# Patient Record
Sex: Male | Born: 1980
Health system: Southern US, Community
[De-identification: ages and names within clinical notes are randomized; demographics above are authoritative.]

## PROBLEM LIST (undated history)

## (undated) DIAGNOSIS — Z9889 Other specified postprocedural states: Secondary | ICD-10-CM

## (undated) DIAGNOSIS — M1990 Unspecified osteoarthritis, unspecified site: Secondary | ICD-10-CM

## (undated) DIAGNOSIS — K259 Gastric ulcer, unspecified as acute or chronic, without hemorrhage or perforation: Secondary | ICD-10-CM

## (undated) HISTORY — PX: KNEE SURGERY: SHX244

---

## 2002-03-07 ENCOUNTER — Emergency Department (HOSPITAL_COMMUNITY): Admission: EM | Admit: 2002-03-07 | Discharge: 2002-03-07 | Payer: Self-pay | Admitting: Emergency Medicine

## 2003-02-09 ENCOUNTER — Emergency Department (HOSPITAL_COMMUNITY): Admission: EM | Admit: 2003-02-09 | Discharge: 2003-02-09 | Payer: Self-pay | Admitting: *Deleted

## 2003-09-07 ENCOUNTER — Emergency Department (HOSPITAL_COMMUNITY): Admission: EM | Admit: 2003-09-07 | Discharge: 2003-09-07 | Payer: Self-pay | Admitting: *Deleted

## 2004-11-27 ENCOUNTER — Emergency Department (HOSPITAL_COMMUNITY): Admission: EM | Admit: 2004-11-27 | Discharge: 2004-11-27 | Payer: Self-pay | Admitting: Family Medicine

## 2005-07-18 ENCOUNTER — Emergency Department (HOSPITAL_COMMUNITY): Admission: EM | Admit: 2005-07-18 | Discharge: 2005-07-18 | Payer: Self-pay | Admitting: Emergency Medicine

## 2005-07-20 ENCOUNTER — Inpatient Hospital Stay (HOSPITAL_COMMUNITY): Admission: EM | Admit: 2005-07-20 | Discharge: 2005-07-22 | Payer: Self-pay | Admitting: Emergency Medicine

## 2005-08-24 ENCOUNTER — Emergency Department (HOSPITAL_COMMUNITY): Admission: EM | Admit: 2005-08-24 | Discharge: 2005-08-24 | Payer: Self-pay | Admitting: Emergency Medicine

## 2007-11-16 ENCOUNTER — Emergency Department (HOSPITAL_COMMUNITY): Admission: EM | Admit: 2007-11-16 | Discharge: 2007-11-16 | Payer: Self-pay | Admitting: Emergency Medicine

## 2008-01-10 ENCOUNTER — Emergency Department (HOSPITAL_COMMUNITY): Admission: EM | Admit: 2008-01-10 | Discharge: 2008-01-10 | Payer: Self-pay | Admitting: Family Medicine

## 2008-07-31 ENCOUNTER — Emergency Department (HOSPITAL_COMMUNITY): Admission: EM | Admit: 2008-07-31 | Discharge: 2008-07-31 | Payer: Self-pay | Admitting: Family Medicine

## 2009-03-16 ENCOUNTER — Emergency Department (HOSPITAL_COMMUNITY): Admission: EM | Admit: 2009-03-16 | Discharge: 2009-03-16 | Payer: Self-pay | Admitting: Family Medicine

## 2009-04-09 ENCOUNTER — Emergency Department (HOSPITAL_COMMUNITY): Admission: EM | Admit: 2009-04-09 | Discharge: 2009-04-09 | Payer: Self-pay | Admitting: Emergency Medicine

## 2009-04-13 ENCOUNTER — Other Ambulatory Visit: Admission: RE | Admit: 2009-04-13 | Discharge: 2009-04-13 | Payer: Self-pay | Admitting: Otolaryngology

## 2009-05-03 ENCOUNTER — Ambulatory Visit (HOSPITAL_BASED_OUTPATIENT_CLINIC_OR_DEPARTMENT_OTHER): Admission: RE | Admit: 2009-05-03 | Discharge: 2009-05-03 | Payer: Self-pay | Admitting: Otolaryngology

## 2009-05-03 ENCOUNTER — Encounter (INDEPENDENT_AMBULATORY_CARE_PROVIDER_SITE_OTHER): Payer: Self-pay | Admitting: Otolaryngology

## 2009-05-18 ENCOUNTER — Ambulatory Visit (HOSPITAL_BASED_OUTPATIENT_CLINIC_OR_DEPARTMENT_OTHER): Admission: RE | Admit: 2009-05-18 | Discharge: 2009-05-18 | Payer: Self-pay | Admitting: General Surgery

## 2011-01-27 LAB — POCT HEMOGLOBIN-HEMACUE
Hemoglobin: 13.7 g/dL (ref 13.0–17.0)
Hemoglobin: 14.5 g/dL (ref 13.0–17.0)

## 2011-03-05 NOTE — Op Note (Signed)
NAMENORM, WRAY NO.:  [id number]   MEDICAL RECORD NO.:  [id number]          PATIENT TYPE:  AMB   LOCATION:  DSC                          FACILITY:  [facility]   PHYSICIAN:  Suzanna Obey, M.D.       DATE OF BIRTH:  11/01/1980   DATE OF PROCEDURE:  05/03/2009  DATE OF DISCHARGE:                               OPERATIVE REPORT   PREOPERATIVE DIAGNOSES:  Right submandibular mass.   POSTOPERATIVE DIAGNOSIS:  Right submandibular mass.   SURGICAL PROCEDURE:  Excision of right submandibular gland with mass.   ANESTHESIA:  General.   ESTIMATED BLOOD LOSS:  Less than 10 mL.   INDICATIONS:  This is a 30 year old with a mass in his right neck that  has failed to resolve after medical therapy.  It is fairly large and  firm and fine-needle aspiration was consistent with possible salivary  gland findings.  He was informed of the risks and benefits of the  procedure and options were discussed.  All questions were answered and  consent was obtained.   OPERATION:  The patient was taken to the operating room, placed in the  supine position.  After general endotracheal tube anesthesia, he was  placed in the left gaze position.  The edge of the mandible was marked.  An incision was made 3-cm below the edge of the mandible down around the  digastric hyoid level.  Dissection was carried down to the platysma  muscle.  The platysma muscle was divided and dissection was carried out  on top of the gland and mass.  The mass was removed which was almost  separate but connected to the submandibular gland.  The submandibular  gland was then dissected from inferior, dissecting along the digastric,  bringing the gland up.  The omohyoid was dissected medially and the  lingual nerve was identified.  The lingual nerve ganglion was divided  and the single nerve was left intact.  The hypoglossal nerve could be  seen in the depths of the bed of the submandibular triangle.  It was not  violated.   The gland was then removed after clamping the artery and the  entire time the tissue was dissected superiorly and along the capsule of  the gland to preserve the marginal mandibular nerve.  The wound was  irrigated.  There was not much space from a standpoint of the defects,  so no drain was placed.  The wound was closed with an interrupted 4-0  chromic, reapproximated the platysma and the subcutaneous tissue with a  running 5-0 nylon.  The patient was awakened, brought to recovery room  in stable condition, and counts correct.           ______________________________  Suzanna Obey, M.D.     JB/MEDQ  D:  05/03/2009  T:  05/04/2009  Job:  [id number]

## 2011-03-05 NOTE — Op Note (Signed)
NAMERAZI, HICKLE NO.:  [id number]   MEDICAL RECORD NO.:  [id number]          PATIENT TYPE:  AMB   LOCATION:  DSC                          FACILITY:  [facility]   PHYSICIAN:  Suzanna Obey, M.D.       DATE OF BIRTH:  1981/04/12   DATE OF PROCEDURE:  05/18/2009  DATE OF DISCHARGE:                               OPERATIVE REPORT   PREOPERATIVE DIAGNOSES:  Deviated septum and turbinate hypertrophy.   POSTOPERATIVE DIAGNOSES:  Deviated septum and turbinate hypertrophy.   SURGICAL PROCEDURES:  Septoplasty and submucous resection of inferior  turbinates.   ANESTHESIA:  General.   ESTIMATED BLOOD LOSS:  Approximately 10 mL.   INDICATIONS:  This is a 30 year old who has had a significant nasal  obstruction, congestion issue, and severe deviation of septum on the  right.  He has failed medical therapy.  He is informed of the risk and  benefits of the procedure and options were discussed.  All questions  were answered and consent was obtained.   OPERATION:  The patient was taken to the operating room and placed in  the supine position.  After general endotracheal tube anesthesia, he was  prepped and draped in the usual sterile manner.  The inferior turbinate  and septum were injected with 1% lidocaine with 1:100,000 epinephrine.  A right hemitransfixion incision was performed raising mucoperichondrial  ostial flap.  The cartilages were divided about 2 cm posterior to the  caudal strut and this deviated portion of the cartilage was removed  which was significantly deviated into the right side.  Jansen-Middleton  forceps were used to remove the deviated portion of the bone.  The 4-mm  osteotome was used to remove the spur inferiorly and this corrected the  septal deflection.  Turbinates were infractured.  Midline incision was  made with 15 blade.  Mucosal flap was elevated superiorly and inferior  mucosa and bone were removed with the turbinate scissors.  The edge was  cauterized with suction cautery and the flap was laid back down over the  raw surface and the turbinates were outfractured.  Hemitransfixion  incision was closed with interrupted 4-0 chromic and 4-0 plain gut  quilting stitch.  The nasopharynx was suctioned out of all blood and  debris and there was good hemostasis, so no packing was placed.  Pledgets were placed along the inferior edge of the turbinate while the  patient was wakened from anesthesia.  He was awakened and brought to  Recovery in stable condition.  Counts were correct.           ______________________________  Suzanna Obey, M.D.     JB/MEDQ  D:  05/18/2009  T:  05/19/2009  Job:  [id number]

## 2011-03-08 NOTE — Op Note (Signed)
Kenneth Glass, Kenneth Glass                ACCOUNT NO.:  [id number]   MEDICAL RECORD NO.:  [id number]          PATIENT TYPE:  INP   LOCATION:  1420                         FACILITY:  [facility]   PHYSICIAN:  Shirley Friar, MDDATE OF BIRTH:  1981/08/25   DATE OF PROCEDURE:  07/21/2005  DATE OF DISCHARGE:                                 OPERATIVE REPORT   PROCEDURE PERFORMED:  Upper endoscopy.   INDICATIONS FOR PROCEDURE:  Melena, recent NSAID use.   MEDICATIONS GIVEN:  Demerol 40 mg IV, Versed 4 mg IV.   DESCRIPTION OF PROCEDURE:  Endoscope was inserted through the oropharynx and  the esophagus was intubated with the upper esophageal sphincter noted at 15  cm from the incisors.  The endoscope was passed down through the esophagus,  which was normal in appearance with gastroesophageal junction noted at  approximately 42 cm from the incisors.  Endoscope was passed down into the  stomach and then the distal portion of the stomach at the pyloric channel  was deformed due to edema with scattered subepithelial hemorrhages  consistent with gastritis around the pyloric channel.  In the immediate  vicinity of this in the pyloric channel there was a large clean based ulcer  approximately 1 cm in size that had surrounding edema and erythema.  No  visible vessel and no black eschar noted in the base of this gastric ulcer.  Despite careful endoscopic evaluation of this area, no visible vessels were  found and no active bleeding was seen.  The endoscope was advanced carefully  into the duodenal bulb which was normal in appearance without additional  ulcers seen.  The endoscope was advanced further down into the second  portion of the duodenum which was normal.  On careful withdrawal of the  endoscope, again the ulcer was re-evaluated and no bleeding stigmata was  seen.  A biopsy of the antrum was taken for CLO testing. Retroflexion  revealed normal angularis, cardia and fundus except for a small hiatal  hernia.   Endoscope was withdrawn and confirmed the above findings.   DIAGNOSES:  1.  Large gastric ulcer, clean based, approximately 1 cm in size in the      pyloric channel.  2.  Hiatal hernia, small.   PLAN:  1.  Continue IV proton pump inhibitor times 24 hours, then change to p.o.      proton pump inhibitor twice daily.  2.  No NSAIDS.  3.  Will repeat EGD in eight to 12 weeks to assess healing of gastric ulcer.      Shirley Friar, MD  Electronically Signed     VCS/MEDQ  D:  07/21/2005  T:  07/22/2005  Job:  [id number]

## 2011-03-08 NOTE — Consult Note (Signed)
Kenneth Glass, Kenneth Glass                ACCOUNT NO.:  [id number]   MEDICAL RECORD NO.:  [id number]          PATIENT TYPE:  INP   LOCATION:  1420                         FACILITY:  [facility]   PHYSICIAN:  Shirley Friar, MDDATE OF BIRTH:  [birth year]   DATE OF CONSULTATION:  DATE OF DISCHARGE:                                   CONSULTATION   INDICATIONS FOR CONSULTATION:  Melena.   HISTORY OF PRESENT ILLNESS:  The patient is a 30 year old black male who  presents with headaches for several weeks that he has been treating for the  past week with Advil, Excedrin, and ibuprofen.  He states he has been taking  ibuprofen 2 or 3 times a day for the last week for his headache.  He had 2  syncopal episodes a few days ago and came into the emergency department  yesterday.  We had a negative CAT scan of his head and had a hemoglobin  which came back low at 7.3, and he was called back to the emergency room for  further evaluation.  He also reports epigastric abdominal pain for the past  2 weeks.  Prior to that, he had back pain.  He does report some nausea and  vomiting intermittently, but he states this has only occurred several times.  The melena preceded his nausea per his history.  He denies any fevers,  chills, hematochezia, or hematemesis.  He denies any tobacco or alcohol  history.   He is hemodynamically stable during his evaluation.  He was found to have a  hemoglobin of 6.9 and hematocrit 20.  He also had black tarry stools on  rectal exam in the emergency department.   PAST MEDICAL HISTORY:  None.   MEDICATIONS:  None except as stated in HPI.   SOCIAL HISTORY:  He denies tobacco and alcohol.   FAMILY HISTORY:  Positive for pancreatic cancer on the mother's side of the  family, sickle cell, diabetes, and possible Crohn's on the father's side.   PHYSICAL EXAMINATION:  VITAL SIGNS:  Temperature 97.6, pulse 88 (101 on  initial presentation), blood pressure 126/76.  GENERAL:  Well  nourished.  In no acute distress.  Alert and awake x4.  HEENT:  Anicteric sclerae.  Oropharynx clear.  CHEST:  Clear to auscultation bilaterally.  CARDIOVASCULAR:  Regular rate and rhythm, without murmurs.  ABDOMEN:  Epigastric tenderness, otherwise nontender, soft, not distended,  with active bowel sounds.  EXTREMITIES:  No edema.   LABORATORY DATA:  White blood count 4.5, hemoglobin 6.9, hematocrit 20,  platelet count 238.   ASSESSMENT:  A 30 year old black male presents with signs and symptoms of  upper gastrointestinal bleed in the setting of recent nonsteroidal  antiinflammatory drug use.  I suspect he has a small peptic ulcer that has  bled, but there is no evidence of an acute active bleed at this time.  He  also could have an arteriovenous malformation or a Mallory-Weiss tear,  although based on his history, I suspect he may have a gastric or duodenal  ulcer.   PLAN:  1.  We will admit  the patient to medicine with GI consultation.  2.  IV PPI 40 mg q.12 h.  3.  Transfuse, volume resuscitate.  4.  Upper endoscopy scheduled for the morning on July 21, 2005.  5.  Will follow up closely.      Shirley Friar, MD  Electronically Signed     VCS/MEDQ  D:  07/20/2005  T:  07/21/2005  Job:  [id number]

## 2011-03-08 NOTE — H&P (Signed)
Glass, Kenneth NO.:  [id number]   MEDICAL RECORD NO.:  [id number]          PATIENT TYPE:  EMS   LOCATION:  ED                           FACILITY:  [facility]   PHYSICIAN:  Deirdre Peer. Polite, M.D. DATE OF BIRTH:  May 26, 1981   DATE OF ADMISSION:  07/20/2005  DATE OF DISCHARGE:                                HISTORY & PHYSICAL   CHIEF COMPLAINT:  Syncope x2.   HISTORY OF PRESENT ILLNESS:  Kenneth Glass is a 30 year old male with really no  significant past medical history who is brought to the ED for evaluation.  According to the patient, he had micturition syncope x2 yesterday.  Patient  was seen in the ED because of that.  In the ED, the patient made mention of  chronic headache that he has been having.  The patient had a CT of his head  which was negative.  He was discharged to home.  Later patient had blood  work which showed a significantly low hemoglobin.  Patient was brought back  to the ED for evaluation.  In the ED patient reported having trouble with  headaches, therefore taking significant pain medications, Goody powder,  Advil, Excedrin.  He denies any bright red emesis.  Denies any emesis, but  does admit to some vague abdominal discomfort.  Does admit to bright colored  stools x2 weeks.  Denies any bright red blood stools.  Denied ever having  history of blood in his stool before.  He does admit to taking significant  NSAIDs.  In addition to micturition syncope, the patient has been fairly  tired, weak, no energy, lightheaded, and dizzy, which required him to work a  little slower at work.  As stated, in the ED the patient was evaluated, had  hemoglobin of 6.9, heme-positive stool, therefore admission is deemed  necessary for further evaluation and treatment.   PAST MEDICAL HISTORY:  Significant for chronic headaches, occasional chronic  back pains for many, many years.   MEDICATIONS:  Goody powders, Advil, Tylenol, and Excedrin.   SOCIAL HISTORY:   Negative for tobacco, alcohol, or drugs.   PAST SURGICAL HISTORY:  Left knee surgical torn ACL.   FAMILY HISTORY:  Mother with hypertension, father with sickle cell disease,  history of CVA, two sisters that are healthy.   ALLERGIES:  Patient has allergy to SULFA.  He is unsure of the reaction.   REVIEW OF SYSTEMS:  As stated in the HPI, otherwise negative.   PHYSICAL EXAMINATION:  GENERAL:  The patient is alert and oriented x3.  VITAL SIGNS:  Blood pressure 130/78, pulse 101, respiratory rate 16,  saturating 100%, temperature 98.3.  HEENT:  Within normal limits.  CHEST:  Clear to auscultation.  CARDIOVASCULAR:  Regular.  ABDOMEN:  Soft, nontender.  No mass appreciated.  EXTREMITIES:  No edema.  Does have pale nail beds.  RECTAL:  Per ER physician.  Hemoccult positive for black stool.   ASSESSMENT:  1.  Symptomatic anemia in a patient with history of chronic NSAID use, heme-      positive stools in the ED.  2.  History of headache.  Please note CT report is negative.   Recommend patient be admitted to a telemetry floor bed.  Patient will be  resuscitated with packed red blood cells.  Will obtain coags and serial H&H.  Obtain a GI evaluation for endoscopy.  I will place the patient on IV PPI.  Will make further recommendations after review of the above studies.      Deirdre Peer. Polite, M.D.  Electronically Signed     RDP/MEDQ  D:  07/20/2005  T:  07/20/2005  Job:  [id number]

## 2011-05-02 ENCOUNTER — Inpatient Hospital Stay (INDEPENDENT_AMBULATORY_CARE_PROVIDER_SITE_OTHER)
Admission: RE | Admit: 2011-05-02 | Discharge: 2011-05-02 | Disposition: A | Discharge status: Home / Self Care | Payer: Self-pay | Source: Ambulatory Visit | Attending: Emergency Medicine | Admitting: Emergency Medicine

## 2011-05-02 DIAGNOSIS — J020 Streptococcal pharyngitis: Secondary | ICD-10-CM

## 2011-05-02 LAB — POCT RAPID STREP A: Streptococcus, Group A Screen (Direct): POSITIVE — AB

## 2012-04-18 ENCOUNTER — Emergency Department (HOSPITAL_BASED_OUTPATIENT_CLINIC_OR_DEPARTMENT_OTHER)
Admission: EM | Admit: 2012-04-18 | Discharge: 2012-04-18 | Disposition: A | Discharge status: Home / Self Care | Payer: Self-pay | Attending: Emergency Medicine | Admitting: Emergency Medicine

## 2012-04-18 ENCOUNTER — Encounter (HOSPITAL_BASED_OUTPATIENT_CLINIC_OR_DEPARTMENT_OTHER): Payer: Self-pay | Admitting: Emergency Medicine

## 2012-04-18 DIAGNOSIS — H6120 Impacted cerumen, unspecified ear: Secondary | ICD-10-CM | POA: Insufficient documentation

## 2012-04-18 NOTE — ED Notes (Signed)
Left earache x 2 weeks.  No fever or injury. No drainage.  Some hearing loss.

## 2012-04-18 NOTE — ED Provider Notes (Signed)
History     CSN: [id number]  Arrival date & time 04/18/12  1234   First MD Initiated Contact with Patient 04/18/12 1248      Chief Complaint  Patient presents with  . Otalgia    (Consider location/radiation/quality/duration/timing/severity/associated sxs/prior treatment) HPI Comments: Pt states that he feels like he has been progressively unable to hear out of it  Patient is a 31 y.o. male presenting with ear pain. The history is provided by the patient. No language interpreter was used.  Otalgia This is a new problem. The current episode started more than 1 week ago. There is pain in the left ear. The problem occurs constantly. The problem has been gradually worsening. There has been no fever. Pertinent negatives include no ear discharge, no rhinorrhea and no sore throat.    History reviewed. No pertinent past medical history.  Past Surgical History  Procedure Date  . Knee surgery     History reviewed. No pertinent family history.  History  Substance Use Topics  . Smoking status: Never Smoker   . Smokeless tobacco: Not on file  . Alcohol Use: No      Review of Systems  Constitutional: Negative.   HENT: Positive for ear pain. Negative for sore throat, rhinorrhea and ear discharge.   Respiratory: Negative.   Cardiovascular: Negative.     Allergies  Sulfa antibiotics  Home Medications  No current outpatient prescriptions on file.  BP 114/73  Pulse 81  Temp 98.2 F (36.8 C) (Oral)  Resp 16  SpO2 99%  Physical Exam  Nursing note and vitals reviewed. Constitutional: He appears well-developed and well-nourished.  HENT:  Head: Normocephalic and atraumatic.  Right Ear: External ear normal.       Left cerumen impaction  Cardiovascular: Normal rate and regular rhythm.   Pulmonary/Chest: Effort normal and breath sounds normal.    ED Course  Procedures (including critical care time)  Labs Reviewed - No data to display No results found.   1. Cerumen  impaction       MDM  Ear irrigated my nurse and pt tm intact:pt hearing a lot better         Teressa Lower, NP 04/18/12 1452

## 2012-04-18 NOTE — ED Notes (Signed)
D/c home with family- no new rx given 

## 2012-04-18 NOTE — Discharge Instructions (Signed)
Cerumen Impaction  A cerumen impaction is when the wax in your ear forms a plug. This plug usually causes reduced hearing. Sometimes it also causes an earache or dizziness. Removing a cerumen impaction can be difficult and painful. The wax sticks to the ear canal. The canal is sensitive and bleeds easily. If you try to remove a heavy wax buildup with a cotton tipped swab, you may push it in further.  Irrigation with water, suction, and small ear curettes may be used to clear out the wax. If the impaction is fixed to the skin in the ear canal, ear drops may be needed for a few days to loosen the wax. People who build up a lot of wax frequently can use ear wax removal products available in your local drugstore.  SEEK MEDICAL CARE IF:    You develop an earache, increased hearing loss, or marked dizziness.  Document Released: 11/14/2004 Document Revised: 09/26/2011 Document Reviewed: 01/04/2010  [employer] Patient Information 2012 [employer].

## 2012-04-22 NOTE — ED Provider Notes (Signed)
Medical screening examination/treatment/procedure(s) were performed by non-physician practitioner and as supervising physician I was immediately available for consultation/collaboration.  Cyndra Numbers, MD 04/22/12 [phone]

## 2012-07-15 ENCOUNTER — Emergency Department (HOSPITAL_BASED_OUTPATIENT_CLINIC_OR_DEPARTMENT_OTHER)
Admission: EM | Admit: 2012-07-15 | Discharge: 2012-07-15 | Disposition: A | Discharge status: Home / Self Care | Payer: No Typology Code available for payment source | Attending: Emergency Medicine | Admitting: Emergency Medicine

## 2012-07-15 ENCOUNTER — Encounter (HOSPITAL_BASED_OUTPATIENT_CLINIC_OR_DEPARTMENT_OTHER): Payer: Self-pay

## 2012-07-15 ENCOUNTER — Emergency Department (HOSPITAL_BASED_OUTPATIENT_CLINIC_OR_DEPARTMENT_OTHER): Payer: No Typology Code available for payment source

## 2012-07-15 DIAGNOSIS — S20219A Contusion of unspecified front wall of thorax, initial encounter: Secondary | ICD-10-CM

## 2012-07-15 DIAGNOSIS — S60229A Contusion of unspecified hand, initial encounter: Secondary | ICD-10-CM | POA: Insufficient documentation

## 2012-07-15 DIAGNOSIS — Z882 Allergy status to sulfonamides status: Secondary | ICD-10-CM | POA: Insufficient documentation

## 2012-07-15 DIAGNOSIS — S8000XA Contusion of unspecified knee, initial encounter: Secondary | ICD-10-CM | POA: Insufficient documentation

## 2012-07-15 DIAGNOSIS — S8001XA Contusion of right knee, initial encounter: Secondary | ICD-10-CM

## 2012-07-15 DIAGNOSIS — S60221A Contusion of right hand, initial encounter: Secondary | ICD-10-CM

## 2012-07-15 DIAGNOSIS — Y92410 Unspecified street and highway as the place of occurrence of the external cause: Secondary | ICD-10-CM | POA: Insufficient documentation

## 2012-07-15 MED ORDER — OXYCODONE-ACETAMINOPHEN 5-325 MG PO TABS
1.0000 | ORAL_TABLET | Freq: Once | ORAL | Status: AC
  Administered 2012-07-15: 1 via ORAL
  Filled 2012-07-15 (×2): qty 1

## 2012-07-15 MED ORDER — OXYCODONE-ACETAMINOPHEN 5-325 MG PO TABS: 1.0000 | ORAL_TABLET | ORAL | Status: DC | PRN
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
## 2013-04-08 ENCOUNTER — Emergency Department (HOSPITAL_BASED_OUTPATIENT_CLINIC_OR_DEPARTMENT_OTHER)
Admission: EM | Admit: 2013-04-08 | Discharge: 2013-04-08 | Disposition: A | Discharge status: Home / Self Care | Payer: Self-pay | Attending: Emergency Medicine | Admitting: Emergency Medicine

## 2013-04-08 ENCOUNTER — Encounter (HOSPITAL_BASED_OUTPATIENT_CLINIC_OR_DEPARTMENT_OTHER): Payer: Self-pay

## 2013-04-08 DIAGNOSIS — L738 Other specified follicular disorders: Secondary | ICD-10-CM | POA: Insufficient documentation

## 2013-04-08 DIAGNOSIS — L02215 Cutaneous abscess of perineum: Secondary | ICD-10-CM

## 2013-04-08 DIAGNOSIS — L739 Follicular disorder, unspecified: Secondary | ICD-10-CM

## 2013-04-08 DIAGNOSIS — N498 Inflammatory disorders of other specified male genital organs: Secondary | ICD-10-CM | POA: Insufficient documentation

## 2013-04-08 HISTORY — DX: Other specified postprocedural states: Z98.890

## 2013-04-08 MED ORDER — CLINDAMYCIN HCL 150 MG PO CAPS: 150.0000 mg | ORAL_CAPSULE | Freq: Four times a day (QID) | ORAL | Status: DC

## 2013-04-08 NOTE — ED Notes (Signed)
Pt has quarter size isolated swelling to left testicle with oozing pustulate.

## 2013-04-08 NOTE — ED Provider Notes (Signed)
History     CSN: [id number]  Arrival date & time 04/08/13  0750   First MD Initiated Contact with Patient 04/08/13 0756      Chief Complaint  Patient presents with  . Testicle Pain    (Consider location/radiation/quality/duration/timing/severity/associated sxs/prior treatment) Patient is a 32 y.o. male presenting with abscess.  Abscess Location:  Ano-genital Ano-genital abscess location:  Perineum Abscess quality: draining   Red streaking: no   Duration:  10 days Progression:  Partially resolved Chronicity:  New Context: not diabetes, not immunosuppression, not injected drug use, not insect bite/sting and not skin injury   Relieved by: Pt was in shower this AM and it began draining on its own, shrank in size, much less painful now. Associated symptoms: no fatigue, no fever and no nausea   Risk factors: no hx of MRSA and no prior abscess     Past Medical History  Diagnosis Date  . Hx of excision of mass     Past Surgical History  Procedure Laterality Date  . Knee surgery    . Knee surgery      History reviewed. No pertinent family history.  History  Substance Use Topics  . Smoking status: Never Smoker   . Smokeless tobacco: Not on file  . Alcohol Use: No      Review of Systems  Constitutional: Negative for fever and fatigue.  Gastrointestinal: Negative for nausea and abdominal pain.  Genitourinary: Positive for scrotal swelling. Negative for dysuria, difficulty urinating and testicular pain.  Skin: Positive for color change and wound.    Allergies  Sulfa antibiotics  Home Medications   Current Outpatient Rx  Name  Route  Sig  Dispense  Refill  . clindamycin (CLEOCIN) 150 MG capsule   Oral   Take 1 capsule (150 mg total) by mouth every 6 (six) hours.   28 capsule   0     BP 134/90  Pulse 72  Temp(Src) 98.1 F (36.7 C) (Oral)  Resp 20  Ht 6\' 2"  (1.88 m)  Wt 185 lb (83.915 kg)  BMI 23.74 kg/m2  SpO2 98%  Physical Exam  Nursing note and  vitals reviewed. Constitutional: He appears well-developed and well-nourished. No distress.  Neck: Neck supple.  Pulmonary/Chest: Effort normal. No respiratory distress.  Genitourinary:     Neurological: He is alert.  Skin: Skin is warm. No rash noted.    ED Course  Procedures (including critical care time)  Labs Reviewed - No data to display No results found.   1. Abscess, perineum   2. Folliculitis     ra sat is 98% and I interpret to be normal  MDM  Pt with appearance of abscess to base of scrotum and/or perineum.  Pt denies change to urination, defecation.  I was able to express purulent drainage from opening with reduction in size of the abscess to near normal appearance, no longer fluctuant.  Pt is afebrile, not toxic appearing.  Will refer to urology for follow up if not improving.  Pt encouraged to use sitz baths, will put on clindamycin.           Gavin Pound. Oletta Lamas, MD 04/08/13 [phone]

## 2013-04-08 NOTE — ED Notes (Signed)
MD at bedside. 

## 2013-04-08 NOTE — ED Notes (Addendum)
Pt reports left testicular swelling and pain x 2 weeks

## 2014-02-15 ENCOUNTER — Emergency Department (HOSPITAL_BASED_OUTPATIENT_CLINIC_OR_DEPARTMENT_OTHER)
Admission: EM | Admit: 2014-02-15 | Discharge: 2014-02-15 | Disposition: A | Discharge status: Home / Self Care | Payer: PRIVATE HEALTH INSURANCE | Attending: Emergency Medicine | Admitting: Emergency Medicine

## 2014-02-15 ENCOUNTER — Emergency Department (HOSPITAL_BASED_OUTPATIENT_CLINIC_OR_DEPARTMENT_OTHER): Payer: PRIVATE HEALTH INSURANCE

## 2014-02-15 ENCOUNTER — Encounter (HOSPITAL_BASED_OUTPATIENT_CLINIC_OR_DEPARTMENT_OTHER): Payer: Self-pay | Admitting: Emergency Medicine

## 2014-02-15 DIAGNOSIS — M533 Sacrococcygeal disorders, not elsewhere classified: Secondary | ICD-10-CM | POA: Insufficient documentation

## 2014-02-15 NOTE — Discharge Instructions (Signed)
Musculoskeletal Pain °Musculoskeletal pain is muscle and boney aches and pains. These pains can occur in any part of the body. Your caregiver may treat you without knowing the cause of the pain. They may treat you if blood or urine tests, X-rays, and other tests were normal.  °CAUSES °There is often not a definite cause or reason for these pains. These pains may be caused by a type of germ (virus). The discomfort may also come from overuse. Overuse includes working out too hard when your body is not fit. Boney aches also come from weather changes. Bone is sensitive to atmospheric pressure changes. °HOME CARE INSTRUCTIONS  °· Ask when your test results will be ready. Make sure you get your test results. °· Only take over-the-counter or prescription medicines for pain, discomfort, or fever as directed by your caregiver. If you were given medications for your condition, do not drive, operate machinery or power tools, or sign legal documents for 24 hours. Do not drink alcohol. Do not take sleeping pills or other medications that may interfere with treatment. °· Continue all activities unless the activities cause more pain. When the pain lessens, slowly resume normal activities. Gradually increase the intensity and duration of the activities or exercise. °· During periods of severe pain, bed rest may be helpful. Lay or sit in any position that is comfortable. °· Putting ice on the injured area. °· Put ice in a bag. °· Place a towel between your skin and the bag. °· Leave the ice on for 15 to 20 minutes, 3 to 4 times a day. °· Follow up with your caregiver for continued problems and no reason can be found for the pain. If the pain becomes worse or does not go away, it may be necessary to repeat tests or do additional testing. Your caregiver may need to look further for a possible cause. °SEEK IMMEDIATE MEDICAL CARE IF: °· You have pain that is getting worse and is not relieved by medications. °· You develop chest pain  that is associated with shortness or breath, sweating, feeling sick to your stomach (nauseous), or throw up (vomit). °· Your pain becomes localized to the abdomen. °· You develop any new symptoms that seem different or that concern you. °MAKE SURE YOU:  °· Understand these instructions. °· Will watch your condition. °· Will get help right away if you are not doing well or get worse. °Document Released: 10/07/2005 Document Revised: 12/30/2011 Document Reviewed: 06/11/2013 °ExitCare® Patient Information ©2014 [employer]. ° °

## 2014-02-15 NOTE — ED Notes (Signed)
Tailbone pain pt noticed Sunday night became worse yesterday no injury reported

## 2014-02-15 NOTE — ED Notes (Signed)
MD at bedside. 

## 2014-02-15 NOTE — ED Provider Notes (Signed)
CSN: [id number]     Arrival date & time 02/15/14  [id number] History   First MD Initiated Contact with Patient 02/15/14 [phone]     Chief Complaint  Patient presents with  . coccyx pain      (Consider location/radiation/quality/duration/timing/severity/associated sxs/prior Treatment) HPI 33 year old male comes in today complaining of pain over his coccyx that began yesterday. He can describe no discrete trauma and did not fall. He states the pain is sharp and is in the left coccyx area. He denies any fever, chills, swelling, redness, drainage, or pain with stooling. He has not had any similar pain in the past but does have a history of back problems. He has not taken any medication for this. Past Medical History  Diagnosis Date  . Hx of excision of mass    Past Surgical History  Procedure Laterality Date  . Knee surgery    . Knee surgery     History reviewed. No pertinent family history. History  Substance Use Topics  . Smoking status: Never Smoker   . Smokeless tobacco: Not on file  . Alcohol Use: No    Review of Systems  Constitutional: Negative.   HENT: Negative.   Respiratory: Negative.   Cardiovascular: Negative.   Gastrointestinal: Negative.   Genitourinary: Negative.   Neurological: Negative.       Allergies  Sulfa antibiotics  Home Medications   Prior to Admission medications   Medication Sig Start Date End Date Taking? Authorizing Provider  ibuprofen (ADVIL,MOTRIN) 200 MG tablet Take 400-600 mg by mouth every 8 (eight) hours as needed for moderate pain.   Yes Historical Provider, MD  Pseudoephedrine HCl (SUDAFED PO) Take 1 tablet by mouth daily as needed.   Yes Historical Provider, MD   BP 129/72  Pulse 75  Temp(Src) 98.2 F (36.8 C) (Oral)  Resp 18  SpO2 100% Physical Exam  Nursing note and vitals reviewed. Constitutional: He appears well-developed and well-nourished.  HENT:  Head: Normocephalic.  Eyes: Conjunctivae and EOM are normal. Pupils are equal,  round, and reactive to light.  Neck:  Back exam is normal with no tenderness to palpation over cervical thoracic or lumbar spine. There is tenderness palpation over the lateral aspect of the coccyx on the distal portion. There is no redness, swelling, or fluctuance noted.  Cardiovascular: Normal rate.   Pulmonary/Chest: Effort normal and breath sounds normal.  Abdominal: Soft. Bowel sounds are normal.  Musculoskeletal: Normal range of motion.  Neurological: He is alert. He has normal reflexes. He displays normal reflexes. No cranial nerve deficit. He exhibits normal muscle tone. Coordination normal.  Psychiatric: He has a normal mood and affect. His behavior is normal. Judgment normal.    ED Course  Procedures (including critical care time) Labs Review Labs Reviewed - No data to display  Imaging Review Dg Sacrum/coccyx  02/15/2014   CLINICAL DATA:  pain pain  EXAM: SACRUM AND COCCYX - 2+ VIEW  COMPARISON:  DG LUMBAR SPINE COMPLETE dated 07/22/2005  FINDINGS: There is no evidence of acute fracture or dislocation within the sacrum and coccyx. Grade 2 anterolisthesis is appreciated L5-S1 stable. Bilateral pars defects at L5 are again appreciated.  IMPRESSION: Bilateral pars defects and grade 2 anterolisthesis L5-S1.  No evidence of acute osseous abnormalities in the sacrum and coccyx.   Electronically Signed   By: Salome HolmesHector  Cooper M.D.   On: 02/15/2014 10:13     EKG Interpretation None      MDM   Final diagnoses:  Pain in the  coccyx    Radiographs obtained and no abnormality is seen. I have discussed return precautions specifically including worsening swelling or signs of infection. Patient voices understanding.    Hilario Quarryanielle S Jamiya Nims, MD 02/15/14 1314

## 2014-02-21 ENCOUNTER — Encounter (HOSPITAL_BASED_OUTPATIENT_CLINIC_OR_DEPARTMENT_OTHER): Payer: Self-pay | Admitting: Emergency Medicine

## 2014-02-21 ENCOUNTER — Emergency Department (HOSPITAL_BASED_OUTPATIENT_CLINIC_OR_DEPARTMENT_OTHER)
Admission: EM | Admit: 2014-02-21 | Discharge: 2014-02-21 | Disposition: A | Discharge status: Home / Self Care | Payer: PRIVATE HEALTH INSURANCE | Attending: Emergency Medicine | Admitting: Emergency Medicine

## 2014-02-21 DIAGNOSIS — Z8719 Personal history of other diseases of the digestive system: Secondary | ICD-10-CM | POA: Diagnosis not present

## 2014-02-21 DIAGNOSIS — L0231 Cutaneous abscess of buttock: Secondary | ICD-10-CM | POA: Insufficient documentation

## 2014-02-21 DIAGNOSIS — L0291 Cutaneous abscess, unspecified: Secondary | ICD-10-CM

## 2014-02-21 DIAGNOSIS — L03317 Cellulitis of buttock: Secondary | ICD-10-CM | POA: Diagnosis not present

## 2014-02-21 DIAGNOSIS — L0390 Cellulitis, unspecified: Secondary | ICD-10-CM

## 2014-02-21 HISTORY — DX: Gastric ulcer, unspecified as acute or chronic, without hemorrhage or perforation: K25.9

## 2014-02-21 MED ORDER — CLINDAMYCIN HCL 300 MG PO CAPS: 300.0000 mg | ORAL_CAPSULE | Freq: Four times a day (QID) | ORAL | Status: DC

## 2014-02-21 NOTE — ED Notes (Signed)
Pt with abscesses to sacral area.

## 2014-02-21 NOTE — Discharge Instructions (Signed)
Clindamycin as prescribed.  Warm soaks or sitz baths several times daily.  Return to the emergency department if your pain worsens, or you develop any other new and concerning symptoms.   Abscess An abscess is an infected area that contains a collection of pus and debris.It can occur in almost any part of the body. An abscess is also known as a furuncle or boil. CAUSES  An abscess occurs when tissue gets infected. This can occur from blockage of oil or sweat glands, infection of hair follicles, or a minor injury to the skin. As the body tries to fight the infection, pus collects in the area and creates pressure under the skin. This pressure causes pain. People with weakened immune systems have difficulty fighting infections and get certain abscesses more often.  SYMPTOMS Usually an abscess develops on the skin and becomes a painful mass that is red, warm, and tender. If the abscess forms under the skin, you may feel a moveable soft area under the skin. Some abscesses break open (rupture) on their own, but most will continue to get worse without care. The infection can spread deeper into the body and eventually into the bloodstream, causing you to feel ill.  DIAGNOSIS  Your caregiver will take your medical history and perform a physical exam. A sample of fluid may also be taken from the abscess to determine what is causing your infection. TREATMENT  Your caregiver may prescribe antibiotic medicines to fight the infection. However, taking antibiotics alone usually does not cure an abscess. Your caregiver may need to make a small cut (incision) in the abscess to drain the pus. In some cases, gauze is packed into the abscess to reduce pain and to continue draining the area. HOME CARE INSTRUCTIONS   Only take over-the-counter or prescription medicines for pain, discomfort, or fever as directed by your caregiver.  If you were prescribed antibiotics, take them as directed. Finish them even if you  start to feel better.  If gauze is used, follow your caregiver's directions for changing the gauze.  To avoid spreading the infection:  Keep your draining abscess covered with a bandage.  Wash your hands well.  Do not share personal care items, towels, or whirlpools with others.  Avoid skin contact with others.  Keep your skin and clothes clean around the abscess.  Keep all follow-up appointments as directed by your caregiver. SEEK MEDICAL CARE IF:   You have increased pain, swelling, redness, fluid drainage, or bleeding.  You have muscle aches, chills, or a general ill feeling.  You have a fever. MAKE SURE YOU:   Understand these instructions.  Will watch your condition.  Will get help right away if you are not doing well or get worse. Document Released: 07/17/2005 Document Revised: 04/07/2012 Document Reviewed: 12/20/2011 [facility] Patient Information 2014 [place]Care, [place].

## 2014-02-21 NOTE — ED Provider Notes (Signed)
CSN: [id number]     Arrival date & time 5/4/[id number]  1114 History   First MD Initiated Contact with Patient 02/21/14 1121     Chief Complaint  Patient presents with  . Abscess     (Consider location/radiation/quality/duration/timing/severity/associated sxs/prior Treatment) HPI Comments: Patient is a 33 year old male who presents for evaluation of sacral abscess. This was drained in [place] 2 days ago and he was advised to have followup in 2 days to discuss packing removal. He denies fevers or chills. He states that the area is still sore however is improving somewhat.  Patient is a 33 y.o. male presenting with abscess. The history is provided by the patient.  Abscess Abscess location: Buttock. Abscess quality: draining   Progression:  Improving Chronicity:  New   Past Medical History  Diagnosis Date  . Hx of excision of mass   . Gastric ulcer    Past Surgical History  Procedure Laterality Date  . Knee surgery    . Knee surgery     No family history on file. History  Substance Use Topics  . Smoking status: Never Smoker   . Smokeless tobacco: Not on file  . Alcohol Use: No    Review of Systems  All other systems reviewed and are negative.     Allergies  Sulfa antibiotics  Home Medications   Prior to Admission medications   Medication Sig Start Date End Date Taking? Authorizing Provider  HYDROcodone-acetaminophen (NORCO/VICODIN) 5-325 MG per tablet Take 1 tablet by mouth every 6 (six) hours as needed for moderate pain.   Yes Historical Provider, MD  ibuprofen (ADVIL,MOTRIN) 200 MG tablet Take 400-600 mg by mouth every 8 (eight) hours as needed for moderate pain.    Historical Provider, MD  Pseudoephedrine HCl (SUDAFED PO) Take 1 tablet by mouth daily as needed.    Historical Provider, MD   BP 119/93  Pulse 119  Temp(Src) 98.5 F (36.9 C) (Oral)  Resp 19  Ht 6\' 2"  (1.88 m)  Wt 185 lb (83.915 kg)  BMI 23.74 kg/m2  SpO2 100% Physical Exam  Nursing note and  vitals reviewed. Constitutional: He is oriented to person, place, and time. He appears well-developed and well-nourished. No distress.  HENT:  Head: Normocephalic and atraumatic.  Neck: Normal range of motion. Neck supple.  Neurological: He is alert and oriented to person, place, and time.  Skin: Skin is warm and dry. He is not diaphoretic.  There is a packing in place in the sacral region to the top of the buttock. There is continued per you once present, however no surrounding erythema or areas of fluctuance.    ED Course  Procedures (including critical care time) Labs Review Labs Reviewed - No data to display  Imaging Review No results found.   EKG Interpretation None      MDM   Final diagnoses:  None    Packing removed. Wound continues to drain peripheral and material. I will refrain from re packing and treat with antibiotics and warm soaks.    Geoffery Lyonsouglas Kitrina Maurin, MD 02/21/14 1140

## 2015-07-19 ENCOUNTER — Emergency Department (HOSPITAL_BASED_OUTPATIENT_CLINIC_OR_DEPARTMENT_OTHER): Payer: Self-pay

## 2015-07-19 ENCOUNTER — Emergency Department (HOSPITAL_BASED_OUTPATIENT_CLINIC_OR_DEPARTMENT_OTHER)
Admission: EM | Admit: 2015-07-19 | Discharge: 2015-07-19 | Disposition: A | Discharge status: Home / Self Care | Payer: Self-pay | Attending: Emergency Medicine | Admitting: Emergency Medicine

## 2015-07-19 ENCOUNTER — Encounter (HOSPITAL_BASED_OUTPATIENT_CLINIC_OR_DEPARTMENT_OTHER): Payer: Self-pay

## 2015-07-19 DIAGNOSIS — Z8719 Personal history of other diseases of the digestive system: Secondary | ICD-10-CM | POA: Insufficient documentation

## 2015-07-19 DIAGNOSIS — Z9889 Other specified postprocedural states: Secondary | ICD-10-CM | POA: Insufficient documentation

## 2015-07-19 DIAGNOSIS — Y998 Other external cause status: Secondary | ICD-10-CM | POA: Insufficient documentation

## 2015-07-19 DIAGNOSIS — S8992XA Unspecified injury of left lower leg, initial encounter: Secondary | ICD-10-CM | POA: Insufficient documentation

## 2015-07-19 DIAGNOSIS — Y9289 Other specified places as the place of occurrence of the external cause: Secondary | ICD-10-CM | POA: Insufficient documentation

## 2015-07-19 DIAGNOSIS — Y9389 Activity, other specified: Secondary | ICD-10-CM | POA: Insufficient documentation

## 2015-07-19 DIAGNOSIS — W1839XA Other fall on same level, initial encounter: Secondary | ICD-10-CM | POA: Insufficient documentation

## 2015-07-19 DIAGNOSIS — M25562 Pain in left knee: Secondary | ICD-10-CM

## 2015-07-19 MED ORDER — IBUPROFEN 800 MG PO TABS
800.0000 mg | ORAL_TABLET | Freq: Once | ORAL | Status: AC
  Administered 2015-07-19: 800 mg via ORAL
  Filled 2015-07-19: qty 1

## 2015-07-19 MED ORDER — IBUPROFEN 800 MG PO TABS: 800.0000 mg | ORAL_TABLET | Freq: Three times a day (TID) | ORAL | Status: DC

## 2015-07-19 NOTE — ED Notes (Signed)
Ice pack applied to left knee

## 2015-07-19 NOTE — ED Provider Notes (Signed)
CSN: [id number]     Arrival date & time 07/19/15  1934 History   First MD Initiated Contact with Patient 07/19/15 2050     Chief Complaint  Patient presents with  . Knee Injury    HPI   Kenneth Glass is a 34 y.o. male with a PMH of ACL tear and arthritis who presents to the ED with left knee pain. He reports he "misstepped" on Saturday and fell, landing on the medial aspect of his left knee. He states he has had left knee pain and swelling since that time. He reports his symptoms are constant. He states movement exacerbates his pain. He has tried ice and ibuprofen for symptom relief, which has helped. He denies numbness, paresthesia, weakness.   Past Medical History  Diagnosis Date  . Hx of excision of mass   . Gastric ulcer    Past Surgical History  Procedure Laterality Date  . Knee surgery    . Knee surgery     No family history on file. Social History  Substance Use Topics  . Smoking status: Never Smoker   . Smokeless tobacco: None  . Alcohol Use: No     Review of Systems  Constitutional: Negative for fever and chills.  Musculoskeletal: Positive for joint swelling and arthralgias.  Neurological: Negative for dizziness, syncope, weakness, light-headedness and numbness.  All other systems reviewed and are negative.     Allergies  Sulfa antibiotics  Home Medications   Prior to Admission medications   Medication Sig Start Date End Date Taking? Authorizing Provider  clindamycin (CLEOCIN) 300 MG capsule Take 1 capsule (300 mg total) by mouth 4 (four) times daily. X 7 days 02/21/14   Geoffery Lyons, MD  HYDROcodone-acetaminophen (NORCO/VICODIN) 5-325 MG per tablet Take 1 tablet by mouth every 6 (six) hours as needed for moderate pain.    Historical Provider, MD  ibuprofen (ADVIL,MOTRIN) 800 MG tablet Take 1 tablet (800 mg total) by mouth 3 (three) times daily. 07/19/15   Mady Gemma, PA-C  Pseudoephedrine HCl (SUDAFED PO) Take 1 tablet by mouth daily as needed.     Historical Provider, MD    BP 131/77 mmHg  Pulse 86  Temp(Src) 98 F (36.7 C) (Oral)  Resp 16  Ht  (1.88 m)  Wt 185 lb (83.915 kg)  BMI 23.74 kg/m2  SpO2 98% Physical Exam  Constitutional: He is oriented to person, place, and time. He appears well-developed and well-nourished. No distress.  HENT:  Head: Normocephalic and atraumatic.  Right Ear: External ear normal.  Left Ear: External ear normal.  Nose: Nose normal.  Mouth/Throat: Oropharynx is clear and moist.  Eyes: Conjunctivae and EOM are normal. Pupils are equal, round, and reactive to light. Right eye exhibits no discharge. Left eye exhibits no discharge. No scleral icterus.  Neck: Normal range of motion. Neck supple.  Cardiovascular: Normal rate and regular rhythm.   Pulmonary/Chest: Effort normal. No respiratory distress.  Musculoskeletal: Normal range of motion. He exhibits edema and tenderness.       Left knee: He exhibits swelling. He exhibits normal range of motion, no ecchymosis, no deformity, no erythema, no LCL laxity, normal patellar mobility, no bony tenderness and no MCL laxity. Tenderness found. No medial joint line and no lateral joint line tenderness noted.  Mild tenderness to palpation of posterior aspect of left knee. Mild edema to left knee, no significant erythema or heat. No MCL or LCL laxity. Negative posterior and anterior drawer testing. Full range of  motion of left lower extremity.   Neurological: He is alert and oriented to person, place, and time. He has normal strength. No sensory deficit.  Patient ambulates without difficulty. Strength and sensation intact.  Skin: Skin is warm and dry. No rash noted. He is not diaphoretic. No erythema. No pallor.  Psychiatric: He has a normal mood and affect. His behavior is normal. Judgment and thought content normal.  Nursing note and vitals reviewed.   ED Course  Procedures (including critical care time)  Labs Review Labs Reviewed - No data to  display  Imaging Review Dg Knee Complete 4 Views Left  07/19/2015   CLINICAL DATA:  Left knee pain after fall yesterday. Initial encounter.  EXAM: LEFT KNEE - COMPLETE 4+ VIEW  COMPARISON:  None.  FINDINGS: Changes of prior ACL repair with no comparison to determine stability of bone anchors.  Advanced tricompartmental osteoarthritis for age. There is also tib-fib arthritic change with spurring. Moderate knee joint effusion. No acute fracture or malalignment.  IMPRESSION: 1. Joint effusion without acute osseous finding. 2. Advanced osteoarthritis.   Electronically Signed   By: Marnee Spring M.D.   On: 07/19/2015 21:32     I have personally reviewed and evaluated these images as part of my medical decision-making.   EKG Interpretation None      MDM   Final diagnoses:  Left knee pain    34 year old male presents with left knee pain s/p fall. He reports pain and swelling. He denies numbness, weakness, paresthesia, difficulty ambulating.  Patient is afebrile with stable vitals. TTP over posterior aspect of left knee with mild edema. No significant erythema or heat. No MCL, LCL laxity. Anterior and posterior drawer testing negative. Full range of motion of left lower extremity. Strength and sensation intact. Patient ambulates without difficulty. Distal pulses intact. Compartments soft.  Imaging of left knee obtained, which is negative for acute fracture or malalignment; demonstrates joint effusion and advanced osteoarthritis. Patient given ice, ibuprofen, and knee sleeve. Advised to rest and elevate. Instructed patient to follow up with his orthopedist. He is scheduled to have a left knee replacement in January given his significant osteoarthritis s/p ACL tear. Return precautions discussed.   BP 131/77 mmHg  Pulse 86  Temp(Src) 98 F (36.7 C) (Oral)  Resp 16  Ht  (1.88 m)  Wt 185 lb (83.915 kg)  BMI 23.74 kg/m2  SpO2 98%   Mady Gemma, PA-C 07/20/15 1013  Arby Barrette, MD 08/02/15 [phone]

## 2015-07-19 NOTE — ED Notes (Signed)
Pt verbalizes understanding of d/c instructions and denies any further needs at this time. 

## 2015-07-19 NOTE — ED Notes (Addendum)
Pt states his left "knee gave out" yesterday, and he fell forward but was able to catch himself.  Mild swelling and warmth to knee, walking with little difficulty.  Pt now recalls landing on medial side of knee when he landed.

## 2015-07-19 NOTE — Discharge Instructions (Signed)
1. Medications: ibuprofen, usual home medications 2. Treatment: rest, drink plenty of fluids, ice, elevate, wear knee sleeve 3. Follow Up: please followup with your primary doctor and orthopedist within the next week for discussion of your diagnoses and further evaluation after today's visit; if you do not have a primary care doctor use the resource guide provided to find one; please return to the ER for severe pain, difficulty walking, signs of infection (redness, increased swelling, heat), numbness, weakness, tingling, new or worsening symptoms   Knee Pain The knee is the complex joint between your thigh and your lower leg. It is made up of bones, tendons, ligaments, and cartilage. The bones that make up the knee are:  The femur in the thigh.  The tibia and fibula in the lower leg.  The patella or kneecap riding in the groove on the lower femur. CAUSES  Knee pain is a common complaint with many causes. A few of these causes are:  Injury, such as:  A ruptured ligament or tendon injury.  Torn cartilage.  Medical conditions, such as:  Gout  Arthritis  Infections  Overuse, over training, or overdoing a physical activity. Knee pain can be minor or severe. Knee pain can accompany debilitating injury. Minor knee problems often respond well to self-care measures or get well on their own. More serious injuries may need medical intervention or even surgery. SYMPTOMS The knee is complex. Symptoms of knee problems can vary widely. Some of the problems are:  Pain with movement and weight bearing.  Swelling and tenderness.  Buckling of the knee.  Inability to straighten or extend your knee.  Your knee locks and you cannot straighten it.  Warmth and redness with pain and fever.  Deformity or dislocation of the kneecap. DIAGNOSIS  Determining what is wrong may be very straight forward such as when there is an injury. It can also be challenging because of the complexity of the knee.  Tests to make a diagnosis may include:  Your caregiver taking a history and doing a physical exam.  Routine X-rays can be used to rule out other problems. X-rays will not reveal a cartilage tear. Some injuries of the knee can be diagnosed by:  Arthroscopy a surgical technique by which a small video camera is inserted through tiny incisions on the sides of the knee. This procedure is used to examine and repair internal knee joint problems. Tiny instruments can be used during arthroscopy to repair the torn knee cartilage (meniscus).  Arthrography is a radiology technique. A contrast liquid is directly injected into the knee joint. Internal structures of the knee joint then become visible on X-ray film.  An MRI scan is a non X-ray radiology procedure in which magnetic fields and a computer produce two- or three-dimensional images of the inside of the knee. Cartilage tears are often visible using an MRI scanner. MRI scans have largely replaced arthrography in diagnosing cartilage tears of the knee.  Blood work.  Examination of the fluid that helps to lubricate the knee joint (synovial fluid). This is done by taking a sample out using a needle and a syringe. TREATMENT The treatment of knee problems depends on the cause. Some of these treatments are:  Depending on the injury, proper casting, splinting, surgery, or physical therapy care will be needed.  Give yourself adequate recovery time. Do not overuse your joints. If you begin to get sore during workout routines, back off. Slow down or do fewer repetitions.  For repetitive activities such as  cycling or running, maintain your strength and nutrition.  Alternate muscle groups. For example, if you are a weight lifter, work the upper body on one day and the lower body the next.  Either tight or weak muscles do not give the proper support for your knee. Tight or weak muscles do not absorb the stress placed on the knee joint. Keep the muscles  surrounding the knee strong.  Take care of mechanical problems.  If you have flat feet, orthotics or special shoes may help. See your caregiver if you need help.  Arch supports, sometimes with wedges on the inner or outer aspect of the heel, can help. These can shift pressure away from the side of the knee most bothered by osteoarthritis.  A brace called an "unloader" brace also may be used to help ease the pressure on the most arthritic side of the knee.  If your caregiver has prescribed crutches, braces, wraps or ice, use as directed. The acronym for this is PRICE. This means protection, rest, ice, compression, and elevation.  Nonsteroidal anti-inflammatory drugs (NSAIDs), can help relieve pain. But if taken immediately after an injury, they may actually increase swelling. Take NSAIDs with food in your stomach. Stop them if you develop stomach problems. Do not take these if you have a history of ulcers, stomach pain, or bleeding from the bowel. Do not take without your caregiver's approval if you have problems with fluid retention, heart failure, or kidney problems.  For ongoing knee problems, physical therapy may be helpful.  Glucosamine and chondroitin are over-the-counter dietary supplements. Both may help relieve the pain of osteoarthritis in the knee. These medicines are different from the usual anti-inflammatory drugs. Glucosamine may decrease the rate of cartilage destruction.  Injections of a corticosteroid drug into your knee joint may help reduce the symptoms of an arthritis flare-up. They may provide pain relief that lasts a few months. You may have to wait a few months between injections. The injections do have a small increased risk of infection, water retention, and elevated blood sugar levels.  Hyaluronic acid injected into damaged joints may ease pain and provide lubrication. These injections may work by reducing inflammation. A series of shots may give relief for as long as 6  months.  Topical painkillers. Applying certain ointments to your skin may help relieve the pain and stiffness of osteoarthritis. Ask your pharmacist for suggestions. Many over the-counter products are approved for temporary relief of arthritis pain.  In some countries, doctors often prescribe topical NSAIDs for relief of chronic conditions such as arthritis and tendinitis. A review of treatment with NSAID creams found that they worked as well as oral medications but without the serious side effects. PREVENTION  Maintain a healthy weight. Extra pounds put more strain on your joints.  Get strong, stay limber. Weak muscles are a common cause of knee injuries. Stretching is important. Include flexibility exercises in your workouts.  Be smart about exercise. If you have osteoarthritis, chronic knee pain or recurring injuries, you may need to change the way you exercise. This does not mean you have to stop being active. If your knees ache after jogging or playing basketball, consider switching to swimming, water aerobics, or other low-impact activities, at least for a few days a week. Sometimes limiting high-impact activities will provide relief.  Make sure your shoes fit well. Choose footwear that is right for your sport.  Protect your knees. Use the proper gear for knee-sensitive activities. Use kneepads when playing volleyball or  laying carpet. Buckle your seat belt every time you drive. Most shattered kneecaps occur in car accidents.  Rest when you are tired. SEEK MEDICAL CARE IF:  You have knee pain that is continual and does not seem to be getting better.  SEEK IMMEDIATE MEDICAL CARE IF:  Your knee joint feels hot to the touch and you have a high fever. MAKE SURE YOU:   Understand these instructions.  Will watch your condition.  Will get help right away if you are not doing well or get worse. Document Released: 08/04/2007 Document Revised: 12/30/2011 Document Reviewed:  08/04/2007 [facility] Patient Information 2015 [employer], [place]. This information is not intended to replace advice given to you by your health care provider. Make sure you discuss any questions you have with your health care provider.   Emergency Department Resource Guide 1) Find a Doctor and Pay Out of Pocket Although you won't have to find out who is covered by your insurance plan, it is a good idea to ask around and get recommendations. You will then need to call the office and see if the doctor you have chosen will accept you as a new patient and what types of options they offer for patients who are self-pay. Some doctors offer discounts or will set up payment plans for their patients who do not have insurance, but you will need to ask so you aren't surprised when you get to your appointment.  2) Contact Your Local Health Department Not all health departments have doctors that can see patients for sick visits, but many do, so it is worth a call to see if yours does. If you don't know where your local health department is, you can check in your phone book. The CDC also has a tool to help you locate your state's health department, and many state websites also have listings of all of their local health departments.  3) Find a Walk-in Clinic If your illness is not likely to be very severe or complicated, you may want to try a walk in clinic. These are popping up all over the country in pharmacies, drugstores, and shopping centers. They're usually staffed by nurse practitioners or physician assistants that have been trained to treat common illnesses and complaints. They're usually fairly quick and inexpensive. However, if you have serious medical issues or chronic medical problems, these are probably not your best option.  No Primary Care Doctor: - Call Health Connect at  [phone] - they can help you locate a primary care doctor that  accepts your insurance, provides certain services, etc. - Physician  Referral Service- [phone]  Chronic Pain Problems: Organization         Address  Phone   Notes  [facility]  [phone] Patients need to be referred by their primary care doctor.   Medication Assistance: Organization         Address  Phone   Notes  [facility] Medication [facility] [address] [place] [place], [place] [address] [phone] --Must be a resident of [facility] -- Must have NO insurance coverage whatsoever (no Medicaid/ Medicare, etc.) -- The pt. MUST have a primary care doctor that directs their care regularly and follows them in the community   [facility]  [phone]   [employer]  [phone]    Agencies that provide inexpensive medical care: Organization         Address  Phone   Notes  [facility]  (  [phone]   [facility]    [phone]   [facility] [address] [place], [place] [address] [phone]   [facility] [address] [place] [place] [phone]   Planned Parenthood    [phone]   [facility]    [phone]   [facility]  [address], [place] Phone:  [phone], Fax:  [phone] Hours of Operation:  9 am - 6 pm, M-F.  Also accepts Medicaid/Medicare and self-pay.  [facility]  [address], [place] Phone: [phone], Fax: [phone]. Hours of Operation:  8:30 am - 5:30 pm, M-F.  Also accepts Medicaid and self-pay.  [facility] [place] [address], [place] Phone: [phone]   [facility] [address] Natasha Bence [place], [place] [phone], Ext. [phone] Mondays & Thursdays: 7-9 AM.  First 15 patients are seen on a first come, first serve basis.    Medicaid-accepting [facility] Providers:  Organization         Address  Phone   Notes  [facility] [address], [place] [phone] Also accepts self-pay patients.  [facility] [address] Laurell Josephs [place], [place]  [phone]   [facility] [address], [place] [phone]   [facility] [address], [place] [phone]   Renaye Rakers [address], [place]   [phone] Only accepts [place] Access [place] patients after they have their name applied to their card.   Self-Pay (no insurance) in [facility]:  Organization         Address  Phone   Notes  Sickle Cell Patients, [facility] [address] [place], [place] [phone]   [facility] [address] [place], [place] [phone]   [facility] [place]  [address], [place] [phone]   [facility]/Dr. Osei-Bonsu  [address] [place] or [address], [place] [phone] Phone number for both [place] and [place] locations is the same.  [facility] [address] [place] [phone]   [facility] [address], [place] or [address] [phone]   [facility] [address] [place] [phone], phone; [phone], fax Sees patients 1st and 3rd Saturday of every month.  Must not qualify for public or private insurance (i.e. Medicaid, Medicare, [place] Health Choice, Veterans' Benefits)  Household income should be no more than 200% of the poverty level The clinic cannot treat you if you are pregnant or think you are pregnant  Sexually transmitted diseases are not treated at the clinic.    Dental Care: Organization         Address  Phone  Notes  [facility] Department of [facility] [address] [place], [place] [phone] Accepts children up to age 63 who are enrolled  in [place] or [place] Health Choice; pregnant women with a Medicaid card; and children who have applied for Medicaid or [place] Health Choice, but were declined, whose parents can pay a reduced fee at time of service.  [facility] Department of [employer]  [address] [employer] [phone] Accepts children up to age 56 who are enrolled in Medicaid or [place] Health Choice; pregnant women with a Medicaid card; and children who have applied for Medicaid or [place] Health Choice, but were declined, whose parents can pay a reduced fee at time of service.  [facility]  [address] [place], [place] [phone] Patients are seen by appointment only. Walk-ins are not accepted. [facility] will see patients 56 years of age and older. Monday - Tuesday (8am-5pm) Most Wednesdays (8:30-5pm) $30 per visit, cash only  [facility]  [address], [facility] [phone] Patients are seen by appointment only. Walk-ins are not accepted. [facility] will see patients 37 years of age and older. One Wednesday Evening (Monthly: Volunteer Based).  $30 per visit, cash only  [employer]  [phone] for adults; Children under age 67, call Graduate Pediatric Dentistry at [phone]. Children aged 68-14, please call [phone] to request a pediatric application.  Dental services are provided in all areas of dental care including fillings, crowns and bridges, complete and partial dentures, implants, gum treatment, root canals, and extractions. Preventive care is also provided. Treatment is provided to both adults and children. Patients are selected via a lottery and there is often a waiting list.   [facility] [address], [place]  [phone] [email or web address]   [facility] [address] [place], [place] [phone], Ext. [phone] Second and Fourth Thursday of each month, opens at  6:30 AM; Clinic ends at 9 AM.  Patients are seen on a first-come first-served basis, and a limited number are seen during each clinic.   [facility]  [address] Ether Griffins [place], [place] [phone]   Eligibility Requirements You must have lived in [place], [place], or [place] counties for at least the last three months.   You cannot be eligible for state or federal sponsored [place], including [employer], [place], or [employer].   You generally cannot be eligible for healthcare insurance through your employer.    How to apply: Eligibility screenings are held every Tuesday and Wednesday afternoon from 1:00 pm until 4:00 pm. You do not need an appointment for the interview!  [facility] [address], [place], [place] [phone]   [facility] Health Department  [phone]   [facility] Health Department  [phone]   [facility] Health Department  [phone]    Behavioral Health Resources in the Community: Intensive Outpatient Programs Organization         Address  Phone  Notes  [facility] Services [address] [place], [place] [phone]   [facility] Out[facility] [address], [place], [place] [phone]   [facility] [address] [place], [place]  [phone]   [facility] Mental Health [address]  [place], [place] [phone] or [phone]   Substance Abuse Resources Organization         Address  Phone  Notes  [facility]  [phone]   [facility]  [phone]   [facility]  [phone]   Floydene Flock  [phone]   Residential & Outpatient Substance Abuse Program  [phone]   Psychological Services Organization         Address  Phone  Notes  [facility]  [phone]   [facility]  Services  [phone]   [facility] Mental Health [address] [place]  [phone] or [phone]    Mobile Crisis Teams Organization         Address  Phone  Notes  [facility], Mobile Crisis Care Unit  [phone]   [facility]  [address] [place], [place] [phone]   Doristine Locks [address] [place] [phone]    Self-Help/Support Groups Organization         Address  Phone             Notes  [facility] [place] - variety of support groups  336- I7437963 Call for more information  Narcotics Anonymous (NA), [facility] [address], [employer] [place]  2 meetings at this location   Statistician         Address  Phone  Notes  [facility] [address],    [place]  [phone]   [facility]  [address] [place] [address], [place], [place] [phone]   [facility] [address] [place], [place] [phone] Admissions: 8am-3pm M-F  [facility] [address],    [place], [place] [phone]   [facility] [address] [place], [place], [place] [phone]   [facility] [address],  [place], [place] [phone]   [facility] - Intensive Outpatient [address] Laurell Josephs [address], [place], [place] [phone]   [facility] ([facility] [address] [place]  [place], [place] [phone] or [phone]   [facility] ([facility]) [address], [place], [place] [phone] Accepts Medicaid  [facility] [address]  [address] [place] [phone] Substance Abuse/Addiction Treatment   [facility] Organization         Address  Phone  Notes  [facility]  [phone]   Angie Fava, PhD [address] Ervin Knack [place], [place]   [phone] or [phone]   [facility] Behavioral   [address] [place], [place] [phone]   [facility]  [address], [place], [place] [phone] Insurance/Medicaid/sponsorship through [facility] and Families [address]                                    [place], [place] [phone] Therapy/tele-psych/case  [facility] [address], [place] [phone]    Dr. Lolly Mustache  [phone]   [facility] [place]  [employer] [facility] Dept. 1) [address], [place] 2) [address] [place] 3)  [address], [place] [phone]  [phone]   [facility] Child Abuse Hotline 450-310-1324 or 6093605056 (After Hours)

## 2015-10-25 NOTE — H&P (Signed)
TOTAL KNEE ADMISSION H&P  Patient is being admitted for left total knee arthroplasty.  Subjective:  Chief Complaint:    Left knee primary OA / pain  HPI: Kenneth Glass, 35 y.o. male, has a history of pain and functional disability in the left knee due to arthritis and has failed non-surgical conservative treatments for greater than 12 weeks to include NSAID's and/or analgesics, corticosteriod injections and activity modification.  Onset of symptoms was gradual, starting  years ago with gradually worsening course since that time. The patient noted prior procedures on the knee to include  ACL reconstruction on the left knee(s).  Patient currently rates pain in the left knee(s) at 8 out of 10 with activity. Patient has worsening of pain with activity and weight bearing, pain that interferes with activities of daily living, pain with passive range of motion, crepitus and joint swelling.  Patient has evidence of periarticular osteophytes and joint space narrowing by imaging studies.  There is no active infection.  Risks, benefits and expectations were discussed with the patient.  Risks including but not limited to the risk of anesthesia, blood clots, nerve damage, blood vessel damage, failure of the prosthesis, infection and up to and including death.  Patient understand the risks, benefits and expectations and wishes to proceed with surgery.   PCP: No PCP Per Patient  D/C Plans:      Home with HHPT  Post-op Meds:       No Rx given  Tranexamic Acid:      To be given - IV  Decadron:      Is to be given  FYI:     ASA post-op  Norco post-op    Past Medical History  Diagnosis Date  . Hx of excision of mass   . Gastric ulcer     Past Surgical History  Procedure Laterality Date  . Knee surgery    . Knee surgery      No prescriptions prior to admission   Allergies  Allergen Reactions  . Shellfish Allergy Shortness Of Breath  . Sulfa Antibiotics     Childhood; not sure of rxn    Social  History  Substance Use Topics  . Smoking status: Never Smoker   . Smokeless tobacco: Not on file  . Alcohol Use: No       Review of Systems  Constitutional: Negative.   HENT: Negative.   Eyes: Negative.   Respiratory: Negative.   Cardiovascular: Negative.   Gastrointestinal: Negative.   Genitourinary: Negative.   Musculoskeletal: Positive for joint pain.  Skin: Negative.   Neurological: Negative.   Endo/Heme/Allergies: Negative.   Psychiatric/Behavioral: Negative.      Objective:  Physical Exam  Constitutional: He is oriented to person, place, and time. He appears well-developed.  HENT:  Head: Normocephalic.  Eyes: Pupils are equal, round, and reactive to light.  Neck: Neck supple. No JVD present. No tracheal deviation present. No thyromegaly present.  Cardiovascular: Normal rate, regular rhythm, normal heart sounds and intact distal pulses.   Respiratory: Effort normal and breath sounds normal. No stridor. No respiratory distress. He has no wheezes.  GI: Soft. There is no tenderness. There is no guarding.  Musculoskeletal:       Left knee: He exhibits decreased range of motion, swelling, abnormal alignment and bony tenderness. He exhibits no ecchymosis, no deformity, no laceration and no erythema. Tenderness found.  Lymphadenopathy:    He has no cervical adenopathy.  Neurological: He is alert and oriented to person,  place, and time.  Skin: Skin is warm and dry.  Psychiatric: He has a normal mood and affect.      Labs:  Estimated body mass index is 23.74 kg/(m^2) as calculated from the following:   Height as of 07/19/15: 6\' 2"  (1.88 m).   Weight as of 07/19/15: 83.915 kg (185 lb).   Imaging Review Plain radiographs demonstrate severe degenerative joint disease of the left knee(s). The overall alignment is  mild valgus. The bone quality appears to be good for age and reported activity level.  Assessment/Plan:  End stage arthritis, left knee   The patient  history, physical examination, clinical judgment of the provider and imaging studies are consistent with end stage degenerative joint disease of the left knee(s) and total knee arthroplasty is deemed medically necessary. The treatment options including medical management, injection therapy arthroscopy and arthroplasty were discussed at length. The risks and benefits of total knee arthroplasty were presented and reviewed. The risks due to aseptic loosening, infection, stiffness, patella tracking problems, thromboembolic complications and other imponderables were discussed. The patient acknowledged the explanation, agreed to proceed with the plan and consent was signed. Patient is being admitted for inpatient treatment for surgery, pain control, PT, OT, prophylactic antibiotics, VTE prophylaxis, progressive ambulation and ADL's and discharge planning. The patient is planning to be discharged home with home health services.      Anastasio AuerbachMatthew S. Aztlan Coll   PA-C  10/25/2015, 5:32 PM

## 2015-10-31 NOTE — Patient Instructions (Signed)
Loel DubonnetRashad M Barthel  10/31/2015   Your procedure is scheduled on: 11/07/2015    [facility]  [place]  [place] to 3rd floor to  Short Stay Center at    0700 AM.  Call this number if you have problems the morning of surgery [phone]   Remember: ONLY 1 PERSON MAY GO WITH YOU TO SHORT STAY TO GET  READY MORNING OF YOUR SURGERY.  Do not eat food or drink liquids :After Midnight.     Take these medicines the morning of surgery with A SIP OF WATER:none                                  You may not have any metal on your body including hair pins and              piercings  Do not wear jewelry, , lotions, powders or perfumes, deodorant             Do not wear nail polish.  Do not shave  48 hours prior to surgery.              Men may shave face and neck.   Do not bring valuables to the hospital. [facility] IS NOT             RESPONSIBLE   FOR VALUABLES.  Contacts, dentures or bridgework may not be worn into surgery.  Leave suitcase in the car. After surgery it may be brought to your room.       :  Special Instructions: coughing and deep breathing exercises, leg exercises               Please read over the following fact sheets you were given: _____________________________________________________________________             [facility] - Preparing for [facility] Before surgery, you can play an important role.  Because skin is not sterile, your skin needs to be as free of germs as possible.  You can reduce the number of germs on your skin by washing with CHG (chlorahexidine gluconate) soap before surgery.  CHG is an antiseptic cleaner which kills germs and bonds with the skin to continue killing germs even after washing. Please DO NOT use if you have an allergy to CHG or antibacterial soaps.  If your skin becomes reddened/irritated stop using the CHG and inform your nurse when you arrive at Short Stay. Do not shave (including legs and  underarms) for at least 48 hours prior to the first CHG shower.  You may shave your face/neck. Please follow these instructions carefully:  1.  Shower with CHG Soap the night before surgery and the  morning of Surgery.  2.  If you choose to wash your hair, wash your hair first as usual with your  normal  shampoo.  3.  After you shampoo, rinse your hair and body thoroughly to remove the  shampoo.                           4.  Use CHG as you would any other liquid soap.  You can apply chg directly  to the skin and wash  Gently with a scrungie or clean washcloth.  5.  Apply the CHG Soap to your body ONLY FROM THE NECK DOWN.   Do not use on face/ open                           Wound or open sores. Avoid contact with eyes, ears mouth and genitals (private parts).                       Wash face,  Genitals (private parts) with your normal soap.             6.  Wash thoroughly, paying special attention to the area where your surgery  will be performed.  7.  Thoroughly rinse your body with warm water from the neck down.  8.  DO NOT shower/wash with your normal soap after using and rinsing off  the CHG Soap.                9.  Pat yourself dry with a clean towel.            10.  Wear clean pajamas.            11.  Place clean sheets on your bed the night of your first shower and do not  sleep with pets. Day of Surgery : Do not apply any lotions/deodorants the morning of surgery.  Please wear clean clothes to the hospital/surgery center.  FAILURE TO FOLLOW THESE INSTRUCTIONS MAY RESULT IN THE CANCELLATION OF YOUR SURGERY PATIENT SIGNATURE_________________________________  NURSE SIGNATURE__________________________________  ________________________________________________________________________  WHAT IS A BLOOD TRANSFUSION? Blood Transfusion Information  A transfusion is the replacement of blood or some of its parts. Blood is made up of multiple cells which provide different  functions.  Red blood cells carry oxygen and are used for blood loss replacement.  White blood cells fight against infection.  Platelets control bleeding.  Plasma helps clot blood.  Other blood products are available for specialized needs, such as hemophilia or other clotting disorders. BEFORE THE TRANSFUSION  Who gives blood for transfusions?   Healthy volunteers who are fully evaluated to make sure their blood is safe. This is blood bank blood. Transfusion therapy is the safest it has ever been in the practice of medicine. Before blood is taken from a donor, a complete history is taken to make sure that person has no history of diseases nor engages in risky social behavior (examples are intravenous drug use or sexual activity with multiple partners). The donor's travel history is screened to minimize risk of transmitting infections, such as malaria. The donated blood is tested for signs of infectious diseases, such as HIV and hepatitis. The blood is then tested to be sure it is compatible with you in order to minimize the chance of a transfusion reaction. If you or a relative donates blood, this is often done in anticipation of surgery and is not appropriate for emergency situations. It takes many days to process the donated blood. RISKS AND COMPLICATIONS Although transfusion therapy is very safe and saves many lives, the main dangers of transfusion include:  1. Getting an infectious disease. 2. Developing a transfusion reaction. This is an allergic reaction to something in the blood you were given. Every precaution is taken to prevent this. The decision to have a blood transfusion has been considered carefully by your caregiver before blood is given. Blood is not given unless the benefits outweigh  the risks. AFTER THE TRANSFUSION  Right after receiving a blood transfusion, you will usually feel much better and more energetic. This is especially true if your red blood cells have gotten low  (anemic). The transfusion raises the level of the red blood cells which carry oxygen, and this usually causes an energy increase.  The nurse administering the transfusion will monitor you carefully for complications. HOME CARE INSTRUCTIONS  No special instructions are needed after a transfusion. You may find your energy is better. Speak with your caregiver about any limitations on activity for underlying diseases you may have. SEEK MEDICAL CARE IF:   Your condition is not improving after your transfusion.  You develop redness or irritation at the intravenous (IV) site. SEEK IMMEDIATE MEDICAL CARE IF:  Any of the following symptoms occur over the next 12 hours:  Shaking chills.  You have a temperature by mouth above 102 F (38.9 C), not controlled by medicine.  Chest, back, or muscle pain.  People around you feel you are not acting correctly or are confused.  Shortness of breath or difficulty breathing.  Dizziness and fainting.  You get a rash or develop hives.  You have a decrease in urine output.  Your urine turns a dark color or changes to pink, red, or brown. Any of the following symptoms occur over the next 10 days:  You have a temperature by mouth above 102 F (38.9 C), not controlled by medicine.  Shortness of breath.  Weakness after normal activity.  The white part of the eye turns yellow (jaundice).  You have a decrease in the amount of urine or are urinating less often.  Your urine turns a dark color or changes to pink, red, or brown. Document Released: 10/04/2000 Document Revised: 12/30/2011 Document Reviewed: 05/23/2008 Exit[employer] Patient Information 2014 [place].  _______________________________________________________________________  Incentive Spirometer  An incentive spirometer is a tool that can help keep your lungs clear and active. This tool measures how well you are filling your lungs with each breath. Taking long deep breaths may help  reverse or decrease the chance of developing breathing (pulmonary) problems (especially infection) following:  A long period of time when you are unable to move or be active. BEFORE THE PROCEDURE   If the spirometer includes an indicator to show your best effort, your nurse or respiratory therapist will set it to a desired goal.  If possible, sit up straight or lean slightly forward. Try not to slouch.  Hold the incentive spirometer in an upright position. INSTRUCTIONS FOR USE  3. Sit on the edge of your bed if possible, or sit up as far as you can in bed or on a chair. 4. Hold the incentive spirometer in an upright position. 5. Breathe out normally. 6. Place the mouthpiece in your mouth and seal your lips tightly around it. 7. Breathe in slowly and as deeply as possible, raising the piston or the ball toward the top of the column. 8. Hold your breath for 3-5 seconds or for as long as possible. Allow the piston or ball to fall to the bottom of the column. 9. Remove the mouthpiece from your mouth and breathe out normally. 10. Rest for a few seconds and repeat Steps 1 through 7 at least 10 times every 1-2 hours when you are awake. Take your time and take a few normal breaths between deep breaths. 11. The spirometer may include an indicator to show your best effort. Use the indicator as a goal to work  toward during each repetition. 12. After each set of 10 deep breaths, practice coughing to be sure your lungs are clear. If you have an incision (the cut made at the time of surgery), support your incision when coughing by placing a pillow or rolled up towels firmly against it. Once you are able to get out of bed, walk around indoors and cough well. You may stop using the incentive spirometer when instructed by your caregiver.  RISKS AND COMPLICATIONS  Take your time so you do not get dizzy or light-headed.  If you are in pain, you may need to take or ask for pain medication before doing  incentive spirometry. It is harder to take a deep breath if you are having pain. AFTER USE  Rest and breathe slowly and easily.  It can be helpful to keep track of a log of your progress. Your caregiver can provide you with a simple table to help with this. If you are using the spirometer at home, follow these instructions: [place] IF:   You are having difficultly using the spirometer.  You have trouble using the spirometer as often as instructed.  Your pain medication is not giving enough relief while using the spirometer.  You develop fever of 100.5 F (38.1 C) or higher. SEEK IMMEDIATE MEDICAL CARE IF:   You cough up bloody sputum that had not been present before.  You develop fever of 102 F (38.9 C) or greater.  You develop worsening pain at or near the incision site. MAKE SURE YOU:   Understand these instructions.  Will watch your condition.  Will get help right away if you are not doing well or get worse. Document Released: 02/17/2007 Document Revised: 12/30/2011 Document Reviewed: 04/20/2007 [facility] Patient Information 2014 [place], [place].   ________________________________________________________________________

## 2015-11-01 ENCOUNTER — Encounter (HOSPITAL_COMMUNITY): Payer: Self-pay

## 2015-11-01 ENCOUNTER — Encounter (HOSPITAL_COMMUNITY)
Admission: RE | Admit: 2015-11-01 | Discharge: 2015-11-01 | Disposition: A | Discharge status: Home / Self Care | Payer: Managed Care, Other (non HMO) | Source: Ambulatory Visit | Attending: Orthopedic Surgery | Admitting: Orthopedic Surgery

## 2015-11-01 DIAGNOSIS — Z01812 Encounter for preprocedural laboratory examination: Secondary | ICD-10-CM | POA: Insufficient documentation

## 2015-11-01 HISTORY — DX: Unspecified osteoarthritis, unspecified site: M19.90

## 2015-11-01 LAB — CBC
HEMATOCRIT: 39.6 % (ref 39.0–52.0)
Hemoglobin: 13.4 g/dL (ref 13.0–17.0)
MCH: 29.6 pg (ref 26.0–34.0)
MCHC: 33.8 g/dL (ref 30.0–36.0)
MCV: 87.4 fL (ref 78.0–100.0)
PLATELETS: 260 10*3/uL (ref 150–400)
RBC: 4.53 MIL/uL (ref 4.22–5.81)
RDW: 14 % (ref 11.5–15.5)
WBC: 3.3 10*3/uL — AB (ref 4.0–10.5)

## 2015-11-01 LAB — URINALYSIS, ROUTINE W REFLEX MICROSCOPIC
BILIRUBIN URINE: NEGATIVE
Glucose, UA: NEGATIVE mg/dL
Hgb urine dipstick: NEGATIVE
Ketones, ur: NEGATIVE mg/dL
Leukocytes, UA: NEGATIVE
Nitrite: NEGATIVE
Protein, ur: NEGATIVE mg/dL
SPECIFIC GRAVITY, URINE: 1.011 (ref 1.005–1.030)
pH: 6.5 (ref 5.0–8.0)

## 2015-11-01 LAB — BASIC METABOLIC PANEL
ANION GAP: 5 (ref 5–15)
BUN: 11 mg/dL (ref 6–20)
CHLORIDE: 105 mmol/L (ref 101–111)
CO2: 29 mmol/L (ref 22–32)
CREATININE: 0.88 mg/dL (ref 0.61–1.24)
Calcium: 9.1 mg/dL (ref 8.9–10.3)
GFR calc non Af Amer: >60 mL/min (ref >60)
Glucose, Bld: 92 mg/dL (ref 65–99)
POTASSIUM: 3.8 mmol/L (ref 3.5–5.1)
SODIUM: 139 mmol/L (ref 135–145)

## 2015-11-01 LAB — ABO/RH: ABO/RH(D): O POS

## 2015-11-01 LAB — PROTIME-INR
INR: 1.07 (ref 0.00–1.49)
Prothrombin Time: 14.1 seconds (ref 11.6–15.2)

## 2015-11-01 LAB — APTT: aPTT: 31 seconds (ref 24–37)

## 2015-11-01 LAB — SURGICAL PCR SCREEN
MRSA, PCR: NEGATIVE
Staphylococcus aureus: POSITIVE — AB

## 2015-11-06 NOTE — Anesthesia Preprocedure Evaluation (Addendum)
Anesthesia Evaluation  Patient identified by MRN, date of birth, ID band Patient awake    Reviewed: Allergy & Precautions, NPO status , Patient's Chart, lab work & pertinent test results  Airway Mallampati: II  TM Distance: >3 FB Neck ROM: Full    Dental  (+) Teeth Intact, Dental Advisory Given   Pulmonary neg pulmonary ROS,    Pulmonary exam normal        Cardiovascular negative cardio ROS Normal cardiovascular exam     Neuro/Psych negative neurological ROS  negative psych ROS   GI/Hepatic Neg liver ROS, PUD,   Endo/Other  negative endocrine ROS  Renal/GU negative Renal ROS     Musculoskeletal   Abdominal   Peds  Hematology negative hematology ROS (+)   Anesthesia Other Findings   Reproductive/Obstetrics                            Anesthesia Physical Anesthesia Plan  ASA: I  Anesthesia Plan: MAC and Spinal   Post-op Pain Management:    Induction:   Airway Management Planned: Simple Face Mask  Additional Equipment:   Intra-op Plan:   Post-operative Plan:   Informed Consent: I have reviewed the patients History and Physical, chart, labs and discussed the procedure including the risks, benefits and alternatives for the proposed anesthesia with the patient or authorized representative who has indicated his/her understanding and acceptance.   Dental advisory given  Plan Discussed with: CRNA, Anesthesiologist and Surgeon  Anesthesia Plan Comments:        Anesthesia Quick Evaluation

## 2015-11-07 ENCOUNTER — Encounter (HOSPITAL_COMMUNITY)
Admission: RE | Disposition: A | Discharge status: Home Health | Payer: Self-pay | Source: Ambulatory Visit | Attending: Orthopedic Surgery

## 2015-11-07 ENCOUNTER — Inpatient Hospital Stay (HOSPITAL_COMMUNITY): Payer: Managed Care, Other (non HMO) | Admitting: Anesthesiology

## 2015-11-07 ENCOUNTER — Encounter (HOSPITAL_COMMUNITY): Payer: Self-pay | Admitting: *Deleted

## 2015-11-07 ENCOUNTER — Inpatient Hospital Stay (HOSPITAL_COMMUNITY)
Admission: RE | Admit: 2015-11-07 | Discharge: 2015-11-08 | DRG: 470 | Disposition: A | Discharge status: Home Health | Payer: Managed Care, Other (non HMO) | Source: Ambulatory Visit | Attending: Orthopedic Surgery | Admitting: Orthopedic Surgery

## 2015-11-07 DIAGNOSIS — M65862 Other synovitis and tenosynovitis, left lower leg: Secondary | ICD-10-CM | POA: Diagnosis present

## 2015-11-07 DIAGNOSIS — M1712 Unilateral primary osteoarthritis, left knee: Principal | ICD-10-CM | POA: Diagnosis present

## 2015-11-07 DIAGNOSIS — M25562 Pain in left knee: Secondary | ICD-10-CM | POA: Diagnosis present

## 2015-11-07 DIAGNOSIS — Z96652 Presence of left artificial knee joint: Secondary | ICD-10-CM

## 2015-11-07 DIAGNOSIS — Z01812 Encounter for preprocedural laboratory examination: Secondary | ICD-10-CM | POA: Diagnosis not present

## 2015-11-07 DIAGNOSIS — Z96659 Presence of unspecified artificial knee joint: Secondary | ICD-10-CM

## 2015-11-07 HISTORY — PX: TOTAL KNEE ARTHROPLASTY: SHX125

## 2015-11-07 LAB — TYPE AND SCREEN
ABO/RH(D): O POS
ANTIBODY SCREEN: NEGATIVE

## 2015-11-07 SURGERY — ARTHROPLASTY, KNEE, TOTAL
Anesthesia: Monitor Anesthesia Care | Site: Knee | Laterality: Left

## 2015-11-07 MED ORDER — CEFAZOLIN SODIUM-DEXTROSE 2-3 GM-% IV SOLR
2.0000 g | Freq: Four times a day (QID) | INTRAVENOUS | Status: AC
  Administered 2015-11-07 (×2): 2 g via INTRAVENOUS
  Filled 2015-11-07 (×2): qty 50

## 2015-11-07 MED ORDER — METOCLOPRAMIDE HCL 10 MG PO TABS: 5.0000 mg | ORAL_TABLET | Freq: Three times a day (TID) | ORAL | Status: DC | PRN

## 2015-11-07 MED ORDER — DIPHENHYDRAMINE HCL 25 MG PO CAPS: 25.0000 mg | ORAL_CAPSULE | Freq: Four times a day (QID) | ORAL | Status: DC | PRN

## 2015-11-07 MED ORDER — FENTANYL CITRATE (PF) 100 MCG/2ML IJ SOLN
INTRAMUSCULAR | Status: AC
  Filled 2015-11-07: qty 2

## 2015-11-07 MED ORDER — HYDROMORPHONE HCL 1 MG/ML IJ SOLN
0.5000 mg | INTRAMUSCULAR | Status: DC | PRN
  Administered 2015-11-07: 1 mg via INTRAVENOUS
  Administered 2015-11-07: 2 mg via INTRAVENOUS
  Administered 2015-11-07: 1 mg via INTRAVENOUS
  Filled 2015-11-07: qty 2
  Filled 2015-11-07 (×2): qty 1

## 2015-11-07 MED ORDER — ONDANSETRON HCL 4 MG/2ML IJ SOLN: 4.0000 mg | Freq: Four times a day (QID) | INTRAMUSCULAR | Status: DC | PRN

## 2015-11-07 MED ORDER — HYDROMORPHONE HCL 1 MG/ML IJ SOLN
0.2500 mg | INTRAMUSCULAR | Status: DC | PRN
  Administered 2015-11-07 (×4): 0.5 mg via INTRAVENOUS

## 2015-11-07 MED ORDER — METHOCARBAMOL 1000 MG/10ML IJ SOLN
500.0000 mg | Freq: Four times a day (QID) | INTRAVENOUS | Status: DC | PRN
  Administered 2015-11-07: 500 mg via INTRAVENOUS
  Filled 2015-11-07 (×2): qty 5

## 2015-11-07 MED ORDER — MIDAZOLAM HCL 5 MG/5ML IJ SOLN
INTRAMUSCULAR | Status: DC | PRN
  Administered 2015-11-07: 2 mg via INTRAVENOUS

## 2015-11-07 MED ORDER — HYDROMORPHONE HCL 1 MG/ML IJ SOLN
INTRAMUSCULAR | Status: AC
  Filled 2015-11-07: qty 1

## 2015-11-07 MED ORDER — POLYETHYLENE GLYCOL 3350 17 G PO PACK
17.0000 g | PACK | Freq: Two times a day (BID) | ORAL | Status: DC
  Administered 2015-11-07 – 2015-11-08 (×2): 17 g via ORAL

## 2015-11-07 MED ORDER — BUPIVACAINE-EPINEPHRINE (PF) 0.25% -1:200000 IJ SOLN
INTRAMUSCULAR | Status: AC
  Filled 2015-11-07: qty 30

## 2015-11-07 MED ORDER — ONDANSETRON HCL 4 MG/2ML IJ SOLN
INTRAMUSCULAR | Status: AC
  Filled 2015-11-07: qty 2

## 2015-11-07 MED ORDER — CHLORHEXIDINE GLUCONATE 4 % EX LIQD: 60.0000 mL | Freq: Once | CUTANEOUS | Status: DC

## 2015-11-07 MED ORDER — PROPOFOL 500 MG/50ML IV EMUL
INTRAVENOUS | Status: DC | PRN
  Administered 2015-11-07: 100 ug/kg/min via INTRAVENOUS

## 2015-11-07 MED ORDER — MIDAZOLAM HCL 2 MG/2ML IJ SOLN
INTRAMUSCULAR | Status: AC
  Filled 2015-11-07: qty 2

## 2015-11-07 MED ORDER — MENTHOL 3 MG MT LOZG: 1.0000 | LOZENGE | OROMUCOSAL | Status: DC | PRN

## 2015-11-07 MED ORDER — BUPIVACAINE-EPINEPHRINE (PF) 0.25% -1:200000 IJ SOLN
INTRAMUSCULAR | Status: DC | PRN
  Administered 2015-11-07: 30 mL

## 2015-11-07 MED ORDER — METHOCARBAMOL 500 MG PO TABS
500.0000 mg | ORAL_TABLET | Freq: Four times a day (QID) | ORAL | Status: DC | PRN
  Administered 2015-11-07 – 2015-11-08 (×2): 500 mg via ORAL
  Filled 2015-11-07 (×2): qty 1

## 2015-11-07 MED ORDER — KETOROLAC TROMETHAMINE 30 MG/ML IJ SOLN
INTRAMUSCULAR | Status: AC
  Filled 2015-11-07: qty 1

## 2015-11-07 MED ORDER — PSEUDOEPHEDRINE HCL ER 120 MG PO TB12
120.0000 mg | ORAL_TABLET | Freq: Two times a day (BID) | ORAL | Status: DC
  Administered 2015-11-07 – 2015-11-08 (×2): 120 mg via ORAL
  Filled 2015-11-07 (×3): qty 1

## 2015-11-07 MED ORDER — ONDANSETRON HCL 4 MG/2ML IJ SOLN
INTRAMUSCULAR | Status: DC | PRN
  Administered 2015-11-07: 4 mg via INTRAVENOUS

## 2015-11-07 MED ORDER — ASPIRIN EC 325 MG PO TBEC
325.0000 mg | DELAYED_RELEASE_TABLET | Freq: Two times a day (BID) | ORAL | Status: DC
  Administered 2015-11-08: 325 mg via ORAL
  Filled 2015-11-07 (×3): qty 1

## 2015-11-07 MED ORDER — LACTATED RINGERS IV SOLN
INTRAVENOUS | Status: DC
  Administered 2015-11-07: 1000 mL via INTRAVENOUS
  Administered 2015-11-07: 10:00:00 via INTRAVENOUS

## 2015-11-07 MED ORDER — FENTANYL CITRATE (PF) 100 MCG/2ML IJ SOLN
25.0000 ug | INTRAMUSCULAR | Status: DC | PRN
  Administered 2015-11-07: 100 ug via INTRAVENOUS

## 2015-11-07 MED ORDER — DEXAMETHASONE SODIUM PHOSPHATE 10 MG/ML IJ SOLN
INTRAMUSCULAR | Status: AC
  Filled 2015-11-07: qty 1

## 2015-11-07 MED ORDER — MAGNESIUM CITRATE PO SOLN
1.0000 | Freq: Once | ORAL | Status: DC | PRN
Start: 2015-11-07 — End: 2015-11-08

## 2015-11-07 MED ORDER — CEFAZOLIN SODIUM-DEXTROSE 2-3 GM-% IV SOLR
2.0000 g | INTRAVENOUS | Status: AC
  Administered 2015-11-07: 2 g via INTRAVENOUS

## 2015-11-07 MED ORDER — PHENOL 1.4 % MT LIQD
1.0000 | OROMUCOSAL | Status: DC | PRN
  Filled 2015-11-07: qty 177

## 2015-11-07 MED ORDER — PROPOFOL 10 MG/ML IV BOLUS
INTRAVENOUS | Status: AC
  Filled 2015-11-07: qty 60

## 2015-11-07 MED ORDER — PROMETHAZINE HCL 25 MG/ML IJ SOLN: 6.2500 mg | INTRAMUSCULAR | Status: DC | PRN

## 2015-11-07 MED ORDER — SODIUM CHLORIDE 0.9 % IV SOLN
INTRAVENOUS | Status: DC
  Administered 2015-11-07 – 2015-11-08 (×2): via INTRAVENOUS
  Filled 2015-11-07 (×5): qty 1000

## 2015-11-07 MED ORDER — SODIUM CHLORIDE 0.9 % IR SOLN
Status: DC | PRN
  Administered 2015-11-07: 1000 mL

## 2015-11-07 MED ORDER — FERROUS SULFATE 325 (65 FE) MG PO TABS
325.0000 mg | ORAL_TABLET | Freq: Three times a day (TID) | ORAL | Status: DC
  Administered 2015-11-07 – 2015-11-08 (×3): 325 mg via ORAL
  Filled 2015-11-07 (×5): qty 1

## 2015-11-07 MED ORDER — TRANEXAMIC ACID 1000 MG/10ML IV SOLN
1000.0000 mg | Freq: Once | INTRAVENOUS | Status: AC
  Administered 2015-11-07: 1000 mg via INTRAVENOUS
  Filled 2015-11-07: qty 10

## 2015-11-07 MED ORDER — PROPOFOL 10 MG/ML IV BOLUS
INTRAVENOUS | Status: DC | PRN
  Administered 2015-11-07: 80 mg via INTRAVENOUS

## 2015-11-07 MED ORDER — CEFAZOLIN SODIUM-DEXTROSE 2-3 GM-% IV SOLR
INTRAVENOUS | Status: AC
  Filled 2015-11-07: qty 50

## 2015-11-07 MED ORDER — BISACODYL 10 MG RE SUPP: 10.0000 mg | Freq: Every day | RECTAL | Status: DC | PRN

## 2015-11-07 MED ORDER — DEXAMETHASONE SODIUM PHOSPHATE 10 MG/ML IJ SOLN
10.0000 mg | Freq: Once | INTRAMUSCULAR | Status: AC
  Administered 2015-11-07: 10 mg via INTRAVENOUS

## 2015-11-07 MED ORDER — HYDROCODONE-ACETAMINOPHEN 7.5-325 MG PO TABS
1.0000 | ORAL_TABLET | ORAL | Status: DC
  Administered 2015-11-07 – 2015-11-08 (×7): 2 via ORAL
  Filled 2015-11-07 (×7): qty 2

## 2015-11-07 MED ORDER — BUPIVACAINE IN DEXTROSE 0.75-8.25 % IT SOLN
INTRATHECAL | Status: DC | PRN
  Administered 2015-11-07: 15 mg via INTRATHECAL

## 2015-11-07 MED ORDER — DEXAMETHASONE SODIUM PHOSPHATE 10 MG/ML IJ SOLN
10.0000 mg | Freq: Once | INTRAMUSCULAR | Status: DC
  Filled 2015-11-07: qty 1

## 2015-11-07 MED ORDER — KETOROLAC TROMETHAMINE 30 MG/ML IJ SOLN
INTRAMUSCULAR | Status: DC | PRN
  Administered 2015-11-07: 30 mg

## 2015-11-07 MED ORDER — SODIUM CHLORIDE 0.9 % IJ SOLN
INTRAMUSCULAR | Status: AC
  Filled 2015-11-07: qty 50

## 2015-11-07 MED ORDER — FENTANYL CITRATE (PF) 100 MCG/2ML IJ SOLN
INTRAMUSCULAR | Status: DC | PRN
  Administered 2015-11-07 (×2): 25 ug via INTRAVENOUS
  Administered 2015-11-07 (×3): 50 ug via INTRAVENOUS

## 2015-11-07 MED ORDER — DOCUSATE SODIUM 100 MG PO CAPS
100.0000 mg | ORAL_CAPSULE | Freq: Two times a day (BID) | ORAL | Status: DC
  Administered 2015-11-07 – 2015-11-08 (×2): 100 mg via ORAL

## 2015-11-07 MED ORDER — ONDANSETRON HCL 4 MG PO TABS: 4.0000 mg | ORAL_TABLET | Freq: Four times a day (QID) | ORAL | Status: DC | PRN

## 2015-11-07 MED ORDER — METOCLOPRAMIDE HCL 5 MG/ML IJ SOLN: 5.0000 mg | Freq: Three times a day (TID) | INTRAMUSCULAR | Status: DC | PRN

## 2015-11-07 MED ORDER — ALUM & MAG HYDROXIDE-SIMETH 200-200-20 MG/5ML PO SUSP: 30.0000 mL | ORAL | Status: DC | PRN

## 2015-11-07 MED ORDER — SODIUM CHLORIDE 0.9 % IJ SOLN
INTRAMUSCULAR | Status: DC | PRN
  Administered 2015-11-07: 30 mL

## 2015-11-07 MED ORDER — 0.9 % SODIUM CHLORIDE (POUR BTL) OPTIME
TOPICAL | Status: DC | PRN
  Administered 2015-11-07: 1000 mL

## 2015-11-07 SURGICAL SUPPLY — 52 items
BAG DECANTER FOR FLEXI CONT (MISCELLANEOUS) ×2 IMPLANT
BAG ZIPLOCK 12X15 (MISCELLANEOUS) ×2 IMPLANT
BANDAGE ACE 6X5 VEL STRL LF (GAUZE/BANDAGES/DRESSINGS) ×2 IMPLANT
BANDAGE ELASTIC 6 VELCRO ST LF (GAUZE/BANDAGES/DRESSINGS) ×2 IMPLANT
BLADE SAW SGTL 13.0X1.19X90.0M (BLADE) ×2 IMPLANT
BOWL SMART MIX CTS (DISPOSABLE) ×2 IMPLANT
CAPT KNEE TOTAL 3 ATTUNE ×2 IMPLANT
CEMENT HV SMART SET (Cement) ×4 IMPLANT
CLOTH BEACON ORANGE TIMEOUT ST (SAFETY) ×2 IMPLANT
CUFF TOURN SGL QUICK 34 (TOURNIQUET CUFF) ×1
CUFF TRNQT CYL 34X4X40X1 (TOURNIQUET CUFF) ×1 IMPLANT
DECANTER SPIKE VIAL GLASS SM (MISCELLANEOUS) ×2 IMPLANT
DRAPE U-SHAPE 47X51 STRL (DRAPES) ×2 IMPLANT
DRSG AQUACEL AG ADV 3.5X10 (GAUZE/BANDAGES/DRESSINGS) ×2 IMPLANT
DURAPREP 26ML APPLICATOR (WOUND CARE) ×4 IMPLANT
ELECT REM PT RETURN 9FT ADLT (ELECTROSURGICAL) ×2
ELECTRODE REM PT RTRN 9FT ADLT (ELECTROSURGICAL) ×1 IMPLANT
GLOVE BIOGEL M 7.0 STRL (GLOVE) IMPLANT
GLOVE BIOGEL PI IND STRL 7.0 (GLOVE) ×2 IMPLANT
GLOVE BIOGEL PI IND STRL 7.5 (GLOVE) ×3 IMPLANT
GLOVE BIOGEL PI IND STRL 8.5 (GLOVE) ×1 IMPLANT
GLOVE BIOGEL PI INDICATOR 7.0 (GLOVE) ×2
GLOVE BIOGEL PI INDICATOR 7.5 (GLOVE) ×3
GLOVE BIOGEL PI INDICATOR 8.5 (GLOVE) ×1
GLOVE ECLIPSE 8.0 STRL XLNG CF (GLOVE) ×4 IMPLANT
GLOVE ORTHO TXT STRL SZ7.5 (GLOVE) ×4 IMPLANT
GLOVE SURG SS PI 7.0 STRL IVOR (GLOVE) ×2 IMPLANT
GLOVE SURG SS PI 7.5 STRL IVOR (GLOVE) ×4 IMPLANT
GOWN STRL NON-REIN LRG LVL3 (GOWN DISPOSABLE) ×2 IMPLANT
GOWN STRL REUS W/TWL LRG LVL3 (GOWN DISPOSABLE) ×4 IMPLANT
GOWN STRL REUS W/TWL XL LVL3 (GOWN DISPOSABLE) ×4 IMPLANT
HANDPIECE INTERPULSE COAX TIP (DISPOSABLE) ×1
IMMOBILIZER KNEE 20 (SOFTGOODS) ×2
IMMOBILIZER KNEE 20 THIGH 36 (SOFTGOODS) ×1 IMPLANT
LIQUID BAND (GAUZE/BANDAGES/DRESSINGS) ×2 IMPLANT
MANIFOLD NEPTUNE II (INSTRUMENTS) ×2 IMPLANT
PACK TOTAL KNEE CUSTOM (KITS) ×2 IMPLANT
POSITIONER SURGICAL ARM (MISCELLANEOUS) ×2 IMPLANT
SET HNDPC FAN SPRY TIP SCT (DISPOSABLE) ×1 IMPLANT
SET PAD KNEE POSITIONER (MISCELLANEOUS) ×2 IMPLANT
SUCTION FRAZIER 12FR DISP (SUCTIONS) ×2 IMPLANT
SUT MNCRL AB 4-0 PS2 18 (SUTURE) ×2 IMPLANT
SUT VIC AB 1 CT1 36 (SUTURE) ×2 IMPLANT
SUT VIC AB 2-0 CT1 27 (SUTURE) ×2
SUT VIC AB 2-0 CT1 TAPERPNT 27 (SUTURE) ×2 IMPLANT
SUT VLOC 180 0 24IN GS25 (SUTURE) ×2 IMPLANT
SYR 50ML LL SCALE MARK (SYRINGE) ×2 IMPLANT
TRAY FOLEY W/METER SILVER 14FR (SET/KITS/TRAYS/PACK) IMPLANT
TRAY FOLEY W/METER SILVER 16FR (SET/KITS/TRAYS/PACK) ×2 IMPLANT
WATER STERILE IRR 1500ML POUR (IV SOLUTION) ×2 IMPLANT
WRAP KNEE MAXI GEL POST OP (GAUZE/BANDAGES/DRESSINGS) ×2 IMPLANT
YANKAUER SUCT BULB TIP 10FT TU (MISCELLANEOUS) ×2 IMPLANT

## 2015-11-07 NOTE — Transfer of Care (Signed)
Immediate Anesthesia Transfer of Care Note  Patient: Kenneth Glass  Procedure(s) Performed: Procedure(s): LEFT TOTAL KNEE ARTHROPLASTY (Left)  Patient Location: PACU  Anesthesia Type:General  Level of Consciousness: awake, alert , oriented and patient cooperative  Airway & Oxygen Therapy: Patient Spontanous Breathing and Patient connected to face mask oxygen  Post-op Assessment: Report given to RN and Post -op Vital signs reviewed and stable  Post vital signs: Reviewed  Last Vitals:  Filed Vitals:   11/07/15 0710  BP: 123/83  Pulse: 92  Temp: 36.5 C  Resp: 18    Complications: No apparent anesthesia complications

## 2015-11-07 NOTE — Discharge Instructions (Signed)

## 2015-11-07 NOTE — Anesthesia Postprocedure Evaluation (Signed)
Anesthesia Post Note  Patient: Kenneth Glass  Procedure(s) Performed: Procedure(s) (LRB): LEFT TOTAL KNEE ARTHROPLASTY (Left)  Patient location during evaluation: PACU Anesthesia Type: Spinal and MAC Level of consciousness: awake and alert Pain management: pain level controlled Vital Signs Assessment: post-procedure vital signs reviewed and stable Respiratory status: spontaneous breathing and respiratory function stable Cardiovascular status: blood pressure returned to baseline and stable Postop Assessment: spinal receding Anesthetic complications: no    Last Vitals:  Filed Vitals:   11/07/15 1230 11/07/15 1243  BP: 133/88 126/77  Pulse: 71 77  Temp:  36.7 C  Resp: 15 16    Last Pain:  Filed Vitals:   11/07/15 1246  PainSc: 4                  Ronney [employer] DANIEL

## 2015-11-07 NOTE — Op Note (Signed)
NAME:  Kenneth Glass                      MEDICAL RECORD NO.:  [id number]                             FACILITY:  [facility]      PHYSICIAN:  Madlyn Frankel. Charlann Boxer, M.D.  DATE OF BIRTH:  1981-07-08      DATE OF PROCEDURE:  11/07/2015                                     OPERATIVE REPORT         PREOPERATIVE DIAGNOSIS: 1.  Left knee osteoarthritis, 2. Retained orthopaedic hardware     POSTOPERATIVE DIAGNOSIS:  Left knee osteoarthritis, 2. Retained orthopaedic hardware      FINDINGS:  The patient was noted to have complete loss of cartilage and   bone-on-bone arthritis with associated osteophytes in all three compartments of   the knee with a significant synovitis and associated effusion.      PROCEDURE: 1. Left total knee replacement, 2. Removal deep implant      COMPONENTS USED:  DePuy Attune rotating platform posterior stabilized knee   system, a size 6N femur, 6 tibia, size 7 mm PS AOX insert, and 38 anatomic patellar   button.      SURGEON:  Madlyn Frankel. Charlann Boxer, M.D.      ASSISTANT:  Lanney Gins, PA-C.      ANESTHESIA:  Spinal.      SPECIMENS:  None.      COMPLICATION:  None.      DRAINS:  None  EBL: <50cc      TOURNIQUET TIME:   Total Tourniquet Time Documented: Thigh (Left) - 40 minutes Total: Thigh (Left) - 40 minutes  .      The patient was stable to the recovery room.      INDICATION FOR PROCEDURE:  Kenneth Glass is a 35 y.o. male patient of   mine.  The patient had been seen, evaluated, and treated conservatively in the   office with medication, activity modification, and injections.  The patient had   radiographic changes of bone-on-bone arthritis with endplate sclerosis and osteophytes noted.      The patient failed conservative measures including medication, injections, and activity modification, and at this point was ready for more definitive measures.   Based on the radiographic changes and failed conservative measures, the patient   decided to proceed with  total knee replacement.  Risks of infection,   DVT, component failure, need for revision surgery, postop course, and   expectations were all   discussed and reviewed.  Consent was obtained for benefit of pain   relief.      PROCEDURE IN DETAIL:  The patient was brought to the operative theater.   Once adequate anesthesia, preoperative antibiotics, 2 gm of Ancef, 1 gm of Tranexamic Acid, and 10 mg of Decadron administered, the patient was positioned supine with the left thigh tourniquet placed.  The  left lower extremity was prepped and draped in sterile fashion.  A time-   out was performed identifying the patient, planned procedure, and   extremity.      The left lower extremity was placed in the [facility] leg holder.  The leg was   exsanguinated, tourniquet elevated to  250 mmHg.  A midline incision was   made followed by median parapatellar arthrotomy.  Following initial   exposure, attention was first directed to the patella.  Precut   measurement was noted to be 27 mm.  I resected down to 15 mm and used a   41 anatomic patellar button to restore patellar height as well as cover the cut   surface.      The lug holes were drilled and a metal shim was placed to protect the   patella from retractors and saw blades.      At this point, attention was now directed to the femur.  The femoral   canal was opened with a drill, irrigated to try to prevent fat emboli.  An   intramedullary rod was passed at 5 degrees valgus, 9 mm of bone was   resected off the distal femur.  Following this resection, the tibia was   subluxated anteriorly.  Using the extramedullary guide, 2 mm of bone was resected off   the proximal lateral tibia.  We confirmed the gap would be   stable medially and laterally with a size 6 mm insert as well as confirmed   the cut was perpendicular in the coronal plane, checking with an alignment rod.      Once this was done, I sized the femur to be a size 6 in the anterior-    posterior dimension, chose a narrow component based on medial and   lateral dimension.  The size 6 rotation block was then pinned in   position anterior referenced using the C-clamp to set rotation.  The   anterior, posterior, and  chamfer cuts were made without difficulty nor   notching making certain that I was along the anterior cortex to help   with flexion gap stability.      The final box cut was made off the lateral aspect of distal femur.      At this point, the tibia was sized to be a size 6, the size 6 tray was   then pinned in position through the medial third of the tubercle,   drilled, and keel punched.  Trial reduction was now carried with a 6 femur,  6 tibia, a size 6 then 7 mm insert, and the 38 patella botton.  The knee was brought to   extension, full extension with good flexion stability with the patella   tracking through the trochlea without application of pressure.  Given   all these findings I drilled the femoral lug holes and then removed the trial components.  Final components were   opened and cement was mixed.  The knee was irrigated with normal saline   solution and pulse lavage.  The proximal medial ACL screw was removed as it was felt to be prominent from tibia but did not interfere with the total knee components.  The synovial lining was   then injected with 30 cc of  0.25% Marcaine with epinephrine and 1 cc of Toradol plus 30 cc of NS for a    total of 61 cc.      The knee was irrigated.  Final implants were then cemented onto clean and   dried cut surfaces of bone with the knee brought to extension with a size 7 mm trial insert.      Once the cement had fully cured, the excess cement was removed   throughout the knee.  I confirmed I was satisfied with the range  of   motion and stability, and the final size 7 mm PS AOX insert was chosen.  It was   placed into the knee.      The tourniquet had been let down at 40 minutes.  No significant   hemostasis  required.  The   extensor mechanism was then reapproximated using #1 Vicryl and #0 V-lock sutures with the knee   in flexion.  The   remaining wound was closed with 2-0 Vicryl and running 4-0 Monocryl.   The knee was cleaned, dried, dressed sterilely using Dermabond and   Aquacel dressing.  The patient was then   brought to recovery room in stable condition, tolerating the procedure   well.   Please note that Physician Assistant, Lanney Gins, PA-C, was present for the entirety of the case, and was utilized for pre-operative positioning, peri-operative retractor management, general facilitation of the procedure.  He was also utilized for primary wound closure at the end of the case.              Madlyn Frankel Charlann Boxer, M.D.    11/07/2015 11:20 AM

## 2015-11-07 NOTE — Anesthesia Procedure Notes (Addendum)
Procedure Name: LMA Insertion Date/Time: 11/07/2015 10:20 AM Performed by: Delphia Grates Pre-anesthesia Checklist: Emergency Drugs available, Suction available, Patient being monitored and Patient identified Patient Re-evaluated:Patient Re-evaluated prior to inductionOxygen Delivery Method: Circle system utilized Preoxygenation: Pre-oxygenation with 100% oxygen Intubation Type: IV induction Ventilation: Mask ventilation without difficulty LMA: LMA inserted LMA Size: 4.0 Number of attempts: 1 Placement Confirmation: positive ETCO2 Tube secured with: Tape Dental Injury: Teeth and Oropharynx as per pre-operative assessment    Spinal Patient location during procedure: OR Start time: 11/07/2015 9:44 AM End time: 11/07/2015 9:53 AM Staffing Anesthesiologist: Heather Roberts Performed by: anesthesiologist  Preanesthetic Checklist Completed: patient identified, surgical consent, pre-op evaluation, timeout performed, IV checked, risks and benefits discussed and monitors and equipment checked Spinal Block Patient position: sitting Prep: DuraPrep Patient monitoring: cardiac monitor, continuous pulse ox and blood pressure Approach: midline Location: L2-3 Injection technique: single-shot Needle Needle type: Pencan  Needle gauge: 24 G Needle length: 9 cm Additional Notes Functioning IV was confirmed and monitors were applied. Sterile prep and drape, including hand hygiene and sterile gloves were used. The patient was positioned and the spine was prepped. The skin was anesthetized with lidocaine.  Free flow of clear CSF was obtained prior to injecting local anesthetic into the CSF.  The spinal needle aspirated freely following injection.  The needle was carefully withdrawn.  The patient tolerated the procedure well.

## 2015-11-07 NOTE — Evaluation (Signed)
Physical Therapy Evaluation Patient Details Name: Kenneth Glass MRN: [id number] DOB: Jun 16, 1981 Today's Date: 11/07/2015   History of Present Illness  L tka. retained HW  Clinical Impression  Patient is very sleepy but ambulated x 60'/ Patient will benefit from PT to address problems listed in the note below.    Follow Up Recommendations Home health PT;Supervision/Assistance - 24 hour    Equipment Recommendations  Rolling walker with 5" wheels;Crutches    Recommendations for Other Services       Precautions / Restrictions Precautions Precautions: Knee      Mobility  Bed Mobility Overal bed mobility: Needs Assistance Bed Mobility: Supine to Sit;Sit to Supine     Supine to sit: Min assist Sit to supine: Min assist   General bed mobility comments: support the L leg  Transfers Overall transfer level: Needs assistance Equipment used: Rolling walker (2 wheeled) Transfers: Sit to/from Stand Sit to Stand: Min assist         General transfer comment: cues for ahnd and L leg placement  Ambulation/Gait Ambulation/Gait assistance: Min assist;+2 safety/equipment Ambulation Distance (Feet): 60 Feet Assistive device: Rolling walker (2 wheeled) Gait Pattern/deviations: Step-to pattern;Step-through pattern     General Gait Details: cues for sequence. patient is very sleepy, limited mobility todya.  Stairs            Wheelchair Mobility    Modified Rankin (Stroke Patients Only)       Balance                                             Pertinent Vitals/Pain Pain Assessment: 0-10 Pain Score: 7  Pain Location: l knee, stated getting up and standing  helped the pain Pain Descriptors / Indicators: Aching Pain Intervention(s): Ice applied;Repositioned    Home Living Family/patient expects to be discharged to:: Private residence Living Arrangements: Spouse/significant other Available Help at Discharge: Family Type of Home: House Home  Access: Stairs to enter Entrance Stairs-Rails: None Entrance Stairs-Number of Steps: 3 Home Layout: Bed/bath upstairs;Two level Home Equipment: None      Prior Function                 Hand Dominance        Extremity/Trunk Assessment   Upper Extremity Assessment: Overall WFL for tasks assessed           Lower Extremity Assessment: LLE deficits/detail   LLE Deficits / Details: knee flexed  to 50 degrees  Cervical / Trunk Assessment: Normal  Communication      Cognition Arousal/Alertness: Lethargic;Suspect due to medications Behavior During Therapy: [facility] for tasks assessed/performed Overall Cognitive Status: Within Functional Limits for tasks assessed                      General Comments      Exercises        Assessment/Plan    PT Assessment Patient needs continued PT services  PT Diagnosis Difficulty walking;Acute pain   PT Problem List Decreased strength;Decreased range of motion;Decreased activity tolerance;Decreased mobility;Decreased knowledge of precautions;Decreased safety awareness;Decreased knowledge of use of DME  PT Treatment Interventions DME instruction;Gait training;Stair training;Functional mobility training;Therapeutic activities;Therapeutic exercise;Patient/family education   PT Goals (Current goals can be found in the Care Plan section) Acute Rehab PT Goals Patient Stated Goal: to walk without pain PT Goal Formulation: With patient/family Time For Goal  Achievement: 11/09/15 Potential to Achieve Goals: Good    Frequency 7X/week   Barriers to discharge        Co-evaluation               End of Session   Activity Tolerance: Patient limited by lethargy Patient left: in bed;with call bell/phone within reach;with bed alarm set;with family/visitor present Nurse Communication: Mobility status         Time: 1610-9604 PT Time Calculation (min) (ACUTE ONLY): 21 min   Charges:   PT Evaluation $PT Eval Low  Complexity: 1 Procedure     PT G CodesSharen Heck PT [phone]  11/07/2015, 4:58 PM

## 2015-11-07 NOTE — Interval H&P Note (Signed)
History and Physical Interval Note:  11/07/2015 8:40 AM  Kenneth Glass  has presented today for surgery, with the diagnosis of left knee osteoarthritis  The various methods of treatment have been discussed with the patient and family. After consideration of risks, benefits and other options for treatment, the patient has consented to  Procedure(s): LEFT TOTAL KNEE ARTHROPLASTY (Left) as a surgical intervention .  The patient's history has been reviewed, patient examined, no change in status, stable for surgery.  I have reviewed the patient's chart and labs.  Questions were answered to the patient's satisfaction.     Shelda Pal

## 2015-11-08 LAB — CBC
HEMATOCRIT: 32.6 % — AB (ref 39.0–52.0)
HEMOGLOBIN: 11.4 g/dL — AB (ref 13.0–17.0)
MCH: 30.2 pg (ref 26.0–34.0)
MCHC: 35 g/dL (ref 30.0–36.0)
MCV: 86.2 fL (ref 78.0–100.0)
Platelets: 221 10*3/uL (ref 150–400)
RBC: 3.78 MIL/uL — AB (ref 4.22–5.81)
RDW: 13.6 % (ref 11.5–15.5)
WBC: 10 10*3/uL (ref 4.0–10.5)

## 2015-11-08 LAB — BASIC METABOLIC PANEL
Anion gap: 10 (ref 5–15)
BUN: 8 mg/dL (ref 6–20)
CHLORIDE: 98 mmol/L — AB (ref 101–111)
CO2: 25 mmol/L (ref 22–32)
Calcium: 8.2 mg/dL — ABNORMAL LOW (ref 8.9–10.3)
Creatinine, Ser: 0.67 mg/dL (ref 0.61–1.24)
GFR calc Af Amer: >60 mL/min (ref >60)
GFR calc non Af Amer: >60 mL/min (ref >60)
Glucose, Bld: 133 mg/dL — ABNORMAL HIGH (ref 65–99)
POTASSIUM: 4.3 mmol/L (ref 3.5–5.1)
SODIUM: 133 mmol/L — AB (ref 135–145)

## 2015-11-08 MED ORDER — ASPIRIN 325 MG PO TBEC: 325.0000 mg | DELAYED_RELEASE_TABLET | Freq: Two times a day (BID) | ORAL | Status: AC

## 2015-11-08 MED ORDER — FERROUS SULFATE 325 (65 FE) MG PO TABS: 325.0000 mg | ORAL_TABLET | Freq: Three times a day (TID) | ORAL | Status: DC

## 2015-11-08 MED ORDER — POLYETHYLENE GLYCOL 3350 17 G PO PACK: 17.0000 g | PACK | Freq: Two times a day (BID) | ORAL | Status: DC

## 2015-11-08 MED ORDER — METHOCARBAMOL 500 MG PO TABS: 500.0000 mg | ORAL_TABLET | Freq: Four times a day (QID) | ORAL | Status: DC | PRN

## 2015-11-08 MED ORDER — DOCUSATE SODIUM 100 MG PO CAPS
100.0000 mg | ORAL_CAPSULE | Freq: Two times a day (BID) | ORAL | Status: DC
Start: 2015-11-08 — End: 2016-09-16

## 2015-11-08 MED ORDER — HYDROCODONE-ACETAMINOPHEN 7.5-325 MG PO TABS: 1.0000 | ORAL_TABLET | ORAL | Status: DC | PRN

## 2015-11-08 NOTE — Progress Notes (Signed)
Physical Therapy Treatment Patient Details Name: BETTIE SWAVELY MRN: [id number] DOB: 1981/06/01 Today's Date: 11/08/2015    History of Present Illness L tka. retained HW    PT Comments    Progressing well. Will practice  stairs and DC today  Follow Up Recommendations  Home health PT;Supervision/Assistance - 24 hour     Equipment Recommendations  Rolling walker with 5" wheels;Crutches    Recommendations for Other Services       Precautions / Restrictions Precautions Precautions: Knee    Mobility  Bed Mobility Overal bed mobility: Modified Independent                Transfers   Equipment used: Rolling walker (2 wheeled) Transfers: Sit to/from Stand Sit to Stand: Supervision         General transfer comment: cues for hand and L leg placement  Ambulation/Gait Ambulation/Gait assistance: Min assist Ambulation Distance (Feet): 150 Feet Assistive device: Rolling walker (2 wheeled) Gait Pattern/deviations: Step-through pattern     General Gait Details: cues for sequence. patient is very sleepy, limited mobility todya.   Stairs            Wheelchair Mobility    Modified Rankin (Stroke Patients Only)       Balance                                    Cognition Arousal/Alertness: Awake/alert Behavior During Therapy: WFL for tasks assessed/performed                        Exercises Total Joint Exercises Ankle Circles/Pumps: AROM;Both;10 reps Quad Sets: AROM;Both;10 reps Towel Squeeze: AROM;Left;10 reps Short Arc Quad: AROM;Left;10 reps Heel Slides: AROM;Left;10 reps Hip ABduction/ADduction: AROM;Left;10 reps Straight Leg Raises: AROM;Left;10 reps;Supine Goniometric ROM: 10-60 Lknee    General Comments        Pertinent Vitals/Pain Pain Score: 6  Pain Location: L knee Pain Descriptors / Indicators: Aching Pain Intervention(s): Limited activity within patient's tolerance;Premedicated before session;Ice applied     Home Living                      Prior Function            PT Goals (current goals can now be found in the care plan section) Progress towards PT goals: Progressing toward goals    Frequency  7X/week    PT Plan Current plan remains appropriate    Co-evaluation             End of Session   Activity Tolerance: Patient tolerated treatment well Patient left: in bed;with call bell/phone within reach     Time: 1008-1046 PT Time Calculation (min) (ACUTE ONLY): 38 min  Charges:  $Gait Training: 8-22 mins $Therapeutic Exercise: 8-22 mins $Self Care/Home Management: 07-03-2023                    G Codes:      Rada Hay 11/08/2015, 1:06 PM

## 2015-11-08 NOTE — Evaluation (Signed)
Occupational Therapy Evaluation and Discharge Summary Patient Details Name: Kenneth Glass MRN: [id number] DOB: Jan 15, 1981 Today's Date: 11/08/2015    History of Present Illness L tka. retained HW   Clinical Impression   Pt admitted for above surgery and overall is doing very well with basic adls. Pt does not need follow up OT and all education has been completed with pt and wife.  Rec 3:1.    Follow Up Recommendations  No OT follow up;Supervision - Intermittent    Equipment Recommendations  3 in 1 bedside comode    Recommendations for Other Services       Precautions / Restrictions Precautions Precautions: Knee Restrictions Weight Bearing Restrictions: No      Mobility Bed Mobility Overal bed mobility: Modified Independent Bed Mobility: Supine to Sit     Supine to sit: Modified independent (Device/Increase time)     General bed mobility comments: Pt held his own leg getting up.  Transfers Overall transfer level: Needs assistance Equipment used: Rolling walker (2 wheeled) Transfers: Sit to/from Stand Sit to Stand: Supervision         General transfer comment: cues for ahnd and L leg placement    Balance Overall balance assessment: Needs assistance Sitting-balance support: Feet supported Sitting balance-Leahy Scale: Good     Standing balance support: Bilateral upper extremity supported;During functional activity Standing balance-Leahy Scale: Fair                              ADL Overall ADL's : Needs assistance/impaired Eating/Feeding: Independent;Sitting   Grooming: Supervision/safety;Standing   Upper Body Bathing: Set up;Sitting   Lower Body Bathing: Supervison/ safety;Sit to/from stand   Upper Body Dressing : Set up;Sitting   Lower Body Dressing: Supervision/safety;Sit to/from stand   Toilet Transfer: Scientist, water quality Details (indicate cue type and reason): cues for hand placement Toileting-  Clothing Manipulation and Hygiene: Supervision/safety;Sitting/lateral lean   Tub/ Shower Transfer: Tub transfer;Minimal assistance;Cueing for Water quality scientist Details (indicate cue type and reason): cues for technique for tub transfer. Functional mobility during ADLs: Supervision/safety;Rolling walker General ADL Comments: Pt did well with all adls.  Only concern was with tub transfers.  Tub transfers simulated and pt did well.  Educated wife on how to assist with tub transfers.     Vision Vision Assessment?: No apparent visual deficits   Perception     Praxis      Pertinent Vitals/Pain Pain Assessment: 0-10 Pain Score: 5  Pain Location: L knee Pain Descriptors / Indicators: Aching Pain Intervention(s): Monitored during session;Premedicated before session;Repositioned;Ice applied     Hand Dominance Right   Extremity/Trunk Assessment Upper Extremity Assessment Upper Extremity Assessment: Overall WFL for tasks assessed   Lower Extremity Assessment Lower Extremity Assessment: Defer to PT evaluation   Cervical / Trunk Assessment Cervical / Trunk Assessment: Normal   Communication Communication Communication: No difficulties   Cognition Arousal/Alertness: Awake/alert Behavior During Therapy: WFL for tasks assessed/performed Overall Cognitive Status: Within Functional Limits for tasks assessed                     General Comments       Exercises       Shoulder Instructions      Home Living Family/patient expects to be discharged to:: Private residence Living Arrangements: Spouse/significant other Available Help at Discharge: Family Type of Home: House Home Access: Stairs to enter [employer] of Steps: 3 Entrance Stairs-Rails:  None Home Layout: Bed/bath upstairs;Two level Alternate Level Stairs-Number of Steps: 13 Alternate Level Stairs-Rails: Left Bathroom Shower/Tub: Tub/shower unit;Curtain Shower/tub  characteristics: Engineer, building services: Standard     Home Equipment: None          Prior Functioning/Environment Level of Independence: Independent        Comments: works at Water quality scientist    OT Diagnosis: Generalized weakness;Acute pain   OT Problem List:     OT Treatment/Interventions:      OT Goals(Current goals can be found in the care plan section) Acute Rehab OT Goals Patient Stated Goal: to walk without pain OT Goal Formulation: All assessment and education complete, DC therapy  OT Frequency:     Barriers to D/C:            Co-evaluation              End of Session Equipment Utilized During Treatment: Rolling walker Nurse Communication: Mobility status  Activity Tolerance: Patient tolerated treatment well Patient left: in chair;with call bell/phone within reach;with family/visitor present   Time: 1610-9604 OT Time Calculation (min): 40 min Charges:  OT General Charges $OT Visit: 1 Procedure OT Evaluation $OT Eval Moderate Complexity: 1 Procedure OT Treatments $Self Care/Home Management : 8-22 mins G-Codes:    Hope Budds 11-27-2015, 9:25 AM  [id number]

## 2015-11-08 NOTE — Care Management Note (Signed)
Case Management Note  Patient Details  Name: DRAVYN SEVERS MRN: [id number] Date of Birth: 10-May-1981  Subjective/Objective:                  LEFT TOTAL KNEE ARTHROPLASTY (Left)   Action/Plan: Discharge planning Expected Discharge Date:  11/08/15               Expected Discharge Plan:  [place]  In-House Referral:     Discharge planning Services  CM Consult  Post Acute Care Choice:    Choice offered to:  Patient  DME Arranged:  3-N-1, Walker rolling, Crutches DME Agency:  [place]:  PT [place]:  [place]  Status of Service:  Completed, signed off  Medicare Important Message Given:    Date Medicare IM Given:    Medicare IM give by:    Date Additional Medicare IM Given:    Additional Medicare Important Message give by:     If discussed at [place] of Stay Meetings, dates discussed:    Additional Comments: CM met with pt in room to offer choice of home health agency.  Pt chooses [facility] to render HHPT.  Referral given to [facility] to render HHPT.  CM called [facility] DME rep to please deliver the rolling walker and 3n1 to room.  Ortho tech has delivered the crutches.  [facility] NUMBER given to pt who verbalized understanding he will call the number to secure a PCP.  No other CM needs were communicated. Dellie Catholic, RN 11/08/2015, 1:53 PM

## 2015-11-08 NOTE — Progress Notes (Signed)
     Subjective: 1 Day Post-Op Procedure(s) (LRB): LEFT TOTAL KNEE ARTHROPLASTY (Left)   Patient reports pain as mild, pain controlled. Some increased pain last night, but better today.  No other events throughout the night.  Ready to be discharged home, if he does well with PT and pain stays controlled.  Objective:   VITALS:   Filed Vitals:   11/08/15 0125 11/08/15 0554  BP: 130/94 127/76  Pulse: 75 92  Temp: 97.6 F (36.4 C) 97.8 F (36.6 C)  Resp: 18 18    Dorsiflexion/Plantar flexion intact Incision: dressing C/D/I No cellulitis present Compartment soft  LABS  Recent Labs  11/08/15 0405  HGB 11.4*  HCT 32.6*  WBC 10.0  PLT 221     Recent Labs  11/08/15 0405  NA 133*  K 4.3  BUN 8  CREATININE 0.67  GLUCOSE 133*     Assessment/Plan: 1 Day Post-Op Procedure(s) (LRB): LEFT TOTAL KNEE ARTHROPLASTY (Left) Foley cath d/c'ed Advance diet Up with therapy D/C IV fluids Discharge home with home health  Follow up in 2 weeks at [facility]. Follow up with OLIN,Dannie Hattabaugh D in 2 weeks.  Contact information:  [facility] [address] [place] [address] [phone]         Anastasio Auerbach. Muna Demers   PAC  11/08/2015, 9:21 AM

## 2015-11-09 NOTE — Progress Notes (Signed)
Physical Therapy Treatment/ late entry for 11/08/15 Patient Details Name: Kenneth Glass MRN: [id number] DOB: [birth year] Today's Date: 11/09/2015    History of Present Illness L tka. retained HW    PT Comments    Ready for DC  Follow Up Recommendations  Home health PT;Supervision/Assistance - 24 hour     Equipment Recommendations       Recommendations for Other Services       Precautions / Restrictions Precautions Precautions: Knee    Mobility  Bed Mobility Overal bed mobility: Modified Independent                Transfers Overall transfer level: Modified independent Equipment used: Rolling walker (2 wheeled)                Ambulation/Gait Ambulation/Gait assistance: Supervision Ambulation Distance (Feet): 300 Feet Assistive device: Crutches Gait Pattern/deviations: Step-to pattern     General Gait Details: cues for sequence.    Stairs Stairs: Yes Stairs assistance: Min guard Stair Management: One rail Right;Step to pattern;No rails;With crutches   General stair comments: practiced 4 steps with crutches, nop rail, practiced  4 steps with 1 rail and crutch.  Wheelchair Mobility    Modified Rankin (Stroke Patients Only)       Balance                                    Cognition                            Exercises      General Comments        Pertinent Vitals/Pain Pain Score: 6  Pain Location: left knee Pain Descriptors / Indicators: Aching Pain Intervention(s): Limited activity within patient's tolerance;Repositioned    Home Living                      Prior Function            PT Goals (current goals can now be found in the care plan section) Progress towards PT goals: Progressing toward goals    Frequency       PT Plan Current plan remains appropriate    Co-evaluation             End of Session   Activity Tolerance: Patient tolerated treatment well Patient left: in  bed;with call bell/phone within reach     Time: 1315-1343 PT Time Calculation (min) (ACUTE ONLY): 28 min  Charges:                       G CodesEligha Bridegroom for 11/08/15 11/09/2015, 7:38 AM

## 2015-11-14 ENCOUNTER — Encounter (HOSPITAL_COMMUNITY): Payer: Self-pay | Admitting: Orthopedic Surgery

## 2015-11-14 NOTE — Discharge Summary (Signed)
Physician Discharge Summary  Patient ID: Kenneth Glass MRN: [id number] DOB/AGE: [birth year] 37 y.o.  Admit date: 11/07/2015 Discharge date: 11/08/2015   Procedures:  Procedure(s) (LRB): LEFT TOTAL KNEE ARTHROPLASTY (Left)  Attending Physician:  Dr. Durene Romans   Admission Diagnoses:   Left knee primary OA / pain  Discharge Diagnoses:  Principal Problem:   S/P left TKA Active Problems:   S/P knee replacement  Past Medical History  Diagnosis Date  . Hx of excision of mass   . Gastric ulcer   . Arthritis     HPI:    Kenneth Glass, 35 y.o. male, has a history of pain and functional disability in the left knee due to arthritis and has failed non-surgical conservative treatments for greater than 12 weeks to include NSAID's and/or analgesics, corticosteriod injections and activity modification. Onset of symptoms was gradual, starting years ago with gradually worsening course since that time. The patient noted prior procedures on the knee to include ACL reconstruction on the left knee(s). Patient currently rates pain in the left knee(s) at 8 out of 10 with activity. Patient has worsening of pain with activity and weight bearing, pain that interferes with activities of daily living, pain with passive range of motion, crepitus and joint swelling. Patient has evidence of periarticular osteophytes and joint space narrowing by imaging studies. There is no active infection. Risks, benefits and expectations were discussed with the patient. Risks including but not limited to the risk of anesthesia, blood clots, nerve damage, blood vessel damage, failure of the prosthesis, infection and up to and including death. Patient understand the risks, benefits and expectations and wishes to proceed with surgery.   PCP: No PCP Per Patient   Discharged Condition: good  Hospital Course:  Patient underwent the above stated procedure on 11/07/2015. Patient tolerated the procedure well and brought to  the recovery room in good condition and subsequently to the floor.  POD #1 BP: 127/76 ; Pulse: 92 ; Temp: 97.8 F (36.6 C) ; Resp: 18 Patient reports pain as mild, pain controlled. Some increased pain last night, but better today. No other events throughout the night. Ready to be discharged home. Dorsiflexion/plantar flexion intact, incision: dressing C/D/I, no cellulitis present and compartment soft.   LABS  Basename    HGB     11.4  HCT     32.6    Discharge Exam: General appearance: alert, cooperative and no distress Extremities: Homans sign is negative, no sign of DVT, no edema, redness or tenderness in the calves or thighs and no ulcers, gangrene or trophic changes  Disposition: Home with follow up in 2 weeks   Follow-up Information    Follow up with Shelda Pal, MD. Schedule an appointment as soon as possible for a visit in 2 weeks.   Specialty:  Orthopedic Surgery   Contact information:   [address] [place] [address] [phone]       Follow up with [facility].   Why:  home health physical therapy   Contact information:   [address] [place] [address] [phone]       Follow up with [facility].   Why:  Call this number, follow the prompts to get a primary care physician   Contact information:   [phone]      Discharge Instructions    Call MD / Call 911    Complete by:  As directed   If you experience chest pain or  shortness of breath, CALL 911 and be transported to the hospital emergency room.  If you develope a fever above 101 F, pus (white drainage) or increased drainage or redness at the wound, or calf pain, call your surgeon's office.     Change dressing    Complete by:  As directed   Maintain surgical dressing until follow up in the clinic. If the edges start to pull up, may reinforce with tape. If the dressing is no longer working, may remove and cover with gauze and tape, but must keep  the area dry and clean.  Call with any questions or concerns.     Constipation Prevention    Complete by:  As directed   Drink plenty of fluids.  Prune juice may be helpful.  You may use a stool softener, such as Colace (over the counter) 100 mg twice a day.  Use MiraLax (over the counter) for constipation as needed.     Diet - low sodium heart healthy    Complete by:  As directed      Discharge instructions    Complete by:  As directed   Maintain surgical dressing until follow up in the clinic. If the edges start to pull up, may reinforce with tape. If the dressing is no longer working, may remove and cover with gauze and tape, but must keep the area dry and clean.  Follow up in 2 weeks at [facility]. Call with any questions or concerns.     Increase activity slowly as tolerated    Complete by:  As directed   Weight bearing as tolerated with assist device (walker, cane, etc) as directed, use it as long as suggested by your surgeon or therapist, typically at least 4-6 weeks.     TED hose    Complete by:  As directed   Use stockings (TED hose) for 2 weeks on both leg(s).  You may remove them at night for sleeping.             Medication List    STOP taking these medications        ibuprofen 200 MG tablet  Commonly known as:  ADVIL,MOTRIN     pseudoephedrine-acetaminophen 30-500 MG Tabs tablet  Commonly known as:  TYLENOL SINUS      TAKE these medications        aspirin 325 MG EC tablet  Take 1 tablet (325 mg total) by mouth 2 (two) times daily.     docusate sodium 100 MG capsule  Commonly known as:  COLACE  Take 1 capsule (100 mg total) by mouth 2 (two) times daily.     ferrous sulfate 325 (65 FE) MG tablet  Take 1 tablet (325 mg total) by mouth 3 (three) times daily after meals.     HYDROcodone-acetaminophen 7.5-325 MG tablet  Commonly known as:  NORCO  Take 1-2 tablets by mouth every 4 (four) hours as needed for moderate pain.     methocarbamol 500 MG  tablet  Commonly known as:  ROBAXIN  Take 1 tablet (500 mg total) by mouth every 6 (six) hours as needed for muscle spasms.     polyethylene glycol packet  Commonly known as:  MIRALAX / GLYCOLAX  Take 17 g by mouth 2 (two) times daily.     pseudoephedrine 120 MG 12 hr tablet  Commonly known as:  SUDAFED  Take 120 mg by mouth 2 (two) times daily.         Signed: Molli Hazard  SCarmon Sails   PA-C  11/14/2015, 9:58 AM

## 2015-11-27 ENCOUNTER — Encounter (HOSPITAL_BASED_OUTPATIENT_CLINIC_OR_DEPARTMENT_OTHER): Payer: Self-pay | Admitting: *Deleted

## 2015-11-27 ENCOUNTER — Emergency Department (HOSPITAL_BASED_OUTPATIENT_CLINIC_OR_DEPARTMENT_OTHER)
Admission: EM | Admit: 2015-11-27 | Discharge: 2015-11-27 | Disposition: A | Discharge status: Home / Self Care | Payer: Managed Care, Other (non HMO) | Attending: Emergency Medicine | Admitting: Emergency Medicine

## 2015-11-27 DIAGNOSIS — Z9889 Other specified postprocedural states: Secondary | ICD-10-CM | POA: Insufficient documentation

## 2015-11-27 DIAGNOSIS — Z7982 Long term (current) use of aspirin: Secondary | ICD-10-CM | POA: Diagnosis not present

## 2015-11-27 DIAGNOSIS — M1990 Unspecified osteoarthritis, unspecified site: Secondary | ICD-10-CM | POA: Diagnosis not present

## 2015-11-27 DIAGNOSIS — Z79899 Other long term (current) drug therapy: Secondary | ICD-10-CM | POA: Insufficient documentation

## 2015-11-27 DIAGNOSIS — J069 Acute upper respiratory infection, unspecified: Secondary | ICD-10-CM | POA: Insufficient documentation

## 2015-11-27 DIAGNOSIS — R0981 Nasal congestion: Secondary | ICD-10-CM | POA: Diagnosis present

## 2015-11-27 DIAGNOSIS — Z8719 Personal history of other diseases of the digestive system: Secondary | ICD-10-CM | POA: Insufficient documentation

## 2015-11-27 MED ORDER — PREDNISONE 50 MG PO TABS: 50.0000 mg | ORAL_TABLET | Freq: Every day | ORAL | Status: DC

## 2015-11-27 MED ORDER — GUAIFENESIN ER 1200 MG PO TB12: 1.0000 | ORAL_TABLET | Freq: Two times a day (BID) | ORAL | Status: DC

## 2015-11-27 MED ORDER — BUDESONIDE 32 MCG/ACT NA SUSP: 2.0000 | Freq: Every day | NASAL | Status: DC

## 2015-11-27 MED FILL — predniSONE 50 MG TABS: 50 | 5 days supply | Qty: 5 | Fill #0

## 2015-11-27 MED FILL — BUDESONIDE 32 MCG NASAL SPR: 32 | 30 days supply | Qty: 8 | Fill #0

## 2015-11-27 MED FILL — MUCINEX ER 1,200 MG TABLET: 1200 | 14 days supply | Qty: 28 | Fill #0

## 2015-11-27 NOTE — Discharge Instructions (Signed)
Return here as needed.  Increase your fluid intake and rest as much as possible.  °

## 2015-11-27 NOTE — ED Notes (Signed)
pa at bedside. 

## 2015-11-27 NOTE — ED Notes (Signed)
Pt amb to room 7 with quick steady gait in nad. Pt reports sinus congestion and pain x Friday, took advil yesterday with no relief. Denies cough or chest congestion. Pt states he has had sinus infections in the past and these feel like his usual sinus infection symptoms.

## 2015-11-27 NOTE — ED Provider Notes (Signed)
CSN: [id number]     Arrival date & time 11/27/15  4098 History   First MD Initiated Contact with Patient 11/27/15 [phone]     Chief Complaint  Patient presents with  . Nasal Congestion     (Consider location/radiation/quality/duration/timing/severity/associated sxs/prior Treatment) HPI Patient presents to the emergency department with nasal congestion with headache and mild cough over the last 2 days.  The patient states that he has taken Sudafed over-the-counter without significant relief of his symptoms.  He states that the nasal congestion.  Will switch between the right and left side.  The patient states that he has not had any chest pain, shortness of breath, nausea, vomiting, weakness, dizziness, vision, back pain, neck pain, dysuria, incontinence, bloody stool, hematemesis, edema, sore throat, earache, or syncope.  The patient states that he has not had any fever associated with this illness Past Medical History  Diagnosis Date  . Hx of excision of mass   . Gastric ulcer   . Arthritis    Past Surgical History  Procedure Laterality Date  . Knee surgery    . Knee surgery    . Total knee arthroplasty Left 11/07/2015    Procedure: LEFT TOTAL KNEE ARTHROPLASTY;  Surgeon: Durene Romans, MD;  Location: [facility];  Service: Orthopedics;  Laterality: Left;   History reviewed. No pertinent family history. Social History  Substance Use Topics  . Smoking status: Never Smoker   . Smokeless tobacco: Never Used  . Alcohol Use: No    Review of Systems All other systems negative except as documented in the HPI. All pertinent positives and negatives as reviewed in the HPI.   Allergies  Shellfish allergy and Sulfa antibiotics  Home Medications   Prior to Admission medications   Medication Sig Start Date End Date Taking? Authorizing Provider  aspirin EC 325 MG EC tablet Take 1 tablet (325 mg total) by mouth 2 (two) times daily. 11/08/15 12/06/15  Lanney Gins, PA-C  docusate sodium (COLACE)  100 MG capsule Take 1 capsule (100 mg total) by mouth 2 (two) times daily. 11/08/15   Lanney Gins, PA-C  ferrous sulfate 325 (65 FE) MG tablet Take 1 tablet (325 mg total) by mouth 3 (three) times daily after meals. 11/08/15   Lanney Gins, PA-C  HYDROcodone-acetaminophen (NORCO) 7.5-325 MG tablet Take 1-2 tablets by mouth every 4 (four) hours as needed for moderate pain. 11/08/15   Lanney Gins, PA-C  methocarbamol (ROBAXIN) 500 MG tablet Take 1 tablet (500 mg total) by mouth every 6 (six) hours as needed for muscle spasms. 11/08/15   Lanney Gins, PA-C  polyethylene glycol (MIRALAX / GLYCOLAX) packet Take 17 g by mouth 2 (two) times daily. 11/08/15   Lanney Gins, PA-C  pseudoephedrine (SUDAFED) 120 MG 12 hr tablet Take 120 mg by mouth 2 (two) times daily.    Historical Provider, MD   BP 111/83 mmHg  Pulse 106  Temp(Src) 98.3 F (36.8 C) (Oral)  Resp 18  Ht  (1.88 m)  Wt 82.555 kg  BMI 23.36 kg/m2  SpO2 100% Physical Exam  Constitutional: He is oriented to person, place, and time. He appears well-developed and well-nourished. No distress.  HENT:  Head: Normocephalic and atraumatic.  Nose: Mucosal edema and rhinorrhea present. No sinus tenderness, nasal deformity or septal deviation. No epistaxis.  No foreign bodies. Right sinus exhibits no maxillary sinus tenderness and no frontal sinus tenderness. Left sinus exhibits no maxillary sinus tenderness and no frontal sinus tenderness.  Mouth/Throat: Oropharynx is clear and  moist.  Eyes: Pupils are equal, round, and reactive to light.  Neck: Normal range of motion. Neck supple.  Cardiovascular: Normal rate, regular rhythm and normal heart sounds.  Exam reveals no gallop and no friction rub.   No murmur heard. Pulmonary/Chest: Effort normal and breath sounds normal. No respiratory distress. He has no wheezes.  Neurological: He is alert and oriented to person, place, and time. He exhibits normal muscle tone. Coordination normal.   Skin: Skin is warm and dry. No rash noted. No erythema.  Psychiatric: He has a normal mood and affect. His behavior is normal.  Nursing note and vitals reviewed.   ED Course  Procedures (including critical care time) Labs Review Labs Reviewed - No data to display  Imaging Review No results found. I have personally reviewed and evaluated these images and lab results as part of my medical decision-making.  The patient be treated for viral URI.  Told to return here as needed.  Patient has no fever and minimal symptoms that would explain a bacterial infection in this case.  The patient will be advised to increase his fluid intake, rest as much as possible.  Patient agrees the plan and all questions were answered    Charlestine Night, PA-C 11/27/15 1019  Tilden Fossa, MD 11/28/15 0700

## 2016-09-16 ENCOUNTER — Emergency Department (HOSPITAL_BASED_OUTPATIENT_CLINIC_OR_DEPARTMENT_OTHER)
Admission: EM | Admit: 2016-09-16 | Discharge: 2016-09-16 | Disposition: A | Discharge status: Home / Self Care | Payer: Managed Care, Other (non HMO) | Attending: Emergency Medicine | Admitting: Emergency Medicine

## 2016-09-16 ENCOUNTER — Encounter (HOSPITAL_BASED_OUTPATIENT_CLINIC_OR_DEPARTMENT_OTHER): Payer: Self-pay | Admitting: *Deleted

## 2016-09-16 ENCOUNTER — Emergency Department (HOSPITAL_BASED_OUTPATIENT_CLINIC_OR_DEPARTMENT_OTHER): Payer: Managed Care, Other (non HMO)

## 2016-09-16 DIAGNOSIS — S46912A Strain of unspecified muscle, fascia and tendon at shoulder and upper arm level, left arm, initial encounter: Secondary | ICD-10-CM | POA: Insufficient documentation

## 2016-09-16 DIAGNOSIS — Y9389 Activity, other specified: Secondary | ICD-10-CM | POA: Diagnosis not present

## 2016-09-16 DIAGNOSIS — Z96652 Presence of left artificial knee joint: Secondary | ICD-10-CM | POA: Diagnosis not present

## 2016-09-16 DIAGNOSIS — Y929 Unspecified place or not applicable: Secondary | ICD-10-CM | POA: Diagnosis not present

## 2016-09-16 DIAGNOSIS — W1839XA Other fall on same level, initial encounter: Secondary | ICD-10-CM | POA: Insufficient documentation

## 2016-09-16 DIAGNOSIS — S46902A Unspecified injury of unspecified muscle, fascia and tendon at shoulder and upper arm level, left arm, initial encounter: Secondary | ICD-10-CM | POA: Diagnosis present

## 2016-09-16 DIAGNOSIS — W19XXXA Unspecified fall, initial encounter: Secondary | ICD-10-CM

## 2016-09-16 DIAGNOSIS — Y999 Unspecified external cause status: Secondary | ICD-10-CM | POA: Diagnosis not present

## 2016-09-16 DIAGNOSIS — M25512 Pain in left shoulder: Secondary | ICD-10-CM

## 2016-09-16 NOTE — ED Provider Notes (Signed)
[facility] Provider Note   CSN: [id number] Arrival date & time: 09/16/16  1927  By signing my name below, I, Soijett Blue, attest that this documentation has been prepared under the direction and in the presence of [employer], [employer] Electronically Signed: Soijett Blue, ED Scribe. 09/16/16. 9:19 PM.  History   Chief Complaint Chief Complaint  Patient presents with  . Shoulder Injury    HPI Kenneth Glass is a 35 y.o. male with a PMHx of arthritis, who presents to the Emergency Department complaining of left shoulder injury occurring 4 days ago. Pt notes that he was playing with his kids when he fell forward and attempted to catch himself landing on his outstretched left arm. Pt descries his left shoulder pain as 8/10, constant, sharp, and it doesn't radiate. He states that movement worsens his left shoulder pain. He reports that he has tried ibuprofen and Rx robaxin with mild relief for his symptoms. Pt denies fever, chills, CP, SOB, abdominal pain, nausea, vomiting, diarrhea, constipation, dysuria, hematuria, joint swelling, bruising, skin changes, warmth to joint, numbness, tingling, weakness, and any other symptoms. Pt states that he has seen an orthopedist at [facility] for his knee earlier this year.   The history is provided by the patient and medical records. No language interpreter was used.  Shoulder Injury  This is a new problem. The current episode started more than 2 days ago (4 days). The problem occurs constantly. The problem has not changed since onset.Pertinent negatives include no chest pain, no abdominal pain and no shortness of breath. Exacerbated by: movement. The symptoms are relieved by NSAIDs and medications (robaxin and ibuprofen). Treatments tried: robaxin and ibuprofen. The treatment provided mild relief.    Past Medical History:  Diagnosis Date  . Arthritis   . Gastric ulcer   . Hx of excision of mass     Patient Active  Problem List   Diagnosis Date Noted  . S/P left TKA 11/07/2015  . S/P knee replacement 11/07/2015    Past Surgical History:  Procedure Laterality Date  . KNEE SURGERY    . KNEE SURGERY    . TOTAL KNEE ARTHROPLASTY Left 11/07/2015   Procedure: LEFT TOTAL KNEE ARTHROPLASTY;  Surgeon: Durene Romans, MD;  Location: [facility];  Service: Orthopedics;  Laterality: Left;       Home Medications    Prior to Admission medications   Medication Sig Start Date End Date Taking? Authorizing Provider  Guaifenesin 1200 MG TB12 Take 1 tablet (1,200 mg total) by mouth 2 (two) times daily. 11/27/15   Charlestine Night, PA-C  methocarbamol (ROBAXIN) 500 MG tablet Take 1 tablet (500 mg total) by mouth every 6 (six) hours as needed for muscle spasms. 11/08/15   Lanney Gins, PA-C  polyethylene glycol (MIRALAX / GLYCOLAX) packet Take 17 g by mouth 2 (two) times daily. 11/08/15   Lanney Gins, PA-C  predniSONE (DELTASONE) 50 MG tablet Take 1 tablet (50 mg total) by mouth daily with breakfast. 11/27/15   Charlestine Night, PA-C  pseudoephedrine (SUDAFED) 120 MG 12 hr tablet Take 120 mg by mouth 2 (two) times daily.    Historical Provider, MD    Family History History reviewed. No pertinent family history.  Social History Social History  Substance Use Topics  . Smoking status: Never Smoker  . Smokeless tobacco: Never Used  . Alcohol use No     Allergies   Shellfish allergy and Sulfa antibiotics   Review of Systems Review of Systems  Constitutional: Negative for chills and fever.  Respiratory: Negative for shortness of breath.   Cardiovascular: Negative for chest pain.  Gastrointestinal: Negative for abdominal pain, constipation, diarrhea, nausea and vomiting.  Genitourinary: Negative for dysuria and hematuria.  Musculoskeletal: Positive for arthralgias (left shoulder). Negative for joint swelling.  Skin: Negative for color change.  Allergic/Immunologic: Negative for immunocompromised state.    Neurological: Negative for weakness and numbness.  Psychiatric/Behavioral: Negative for confusion.   A complete 10 system review of systems was obtained and all systems are negative except as noted in the HPI and PMH.   Physical Exam Updated Vital Signs BP 136/95   Pulse 80   Temp 97.8 F (36.6 C)   Resp 20   Ht 6\' 2"  (1.88 m)   Wt 195 lb 14.4 oz (88.9 kg)   SpO2 100%   BMI 25.15 kg/m   Physical Exam  Constitutional: He is oriented to person, place, and time. Vital signs are normal. He appears well-developed and well-nourished.  Non-toxic appearance. No distress.  Afebrile, nontoxic, NAD  HENT:  Head: Normocephalic and atraumatic.  Mouth/Throat: Mucous membranes are normal.  Eyes: Conjunctivae and EOM are normal. Right eye exhibits no discharge. Left eye exhibits no discharge.  Neck: Normal range of motion. Neck supple.  Cardiovascular: Normal rate and intact distal pulses.   Pulmonary/Chest: Effort normal. No respiratory distress.  Abdominal: Normal appearance. He exhibits no distension.  Musculoskeletal: Normal range of motion.       Left shoulder: He exhibits tenderness. He exhibits normal range of motion, no bony tenderness, no swelling, no effusion, no crepitus, no deformity, no spasm, normal pulse and normal strength.  Left shoulder with FROM intact, no bony TTP, with mild supraspinatus muscular TTP without spasms, no swelling/effusion, no bruising or erythema, no warmth, no crepitus/deformity, negative apley scratch, +pain with resisted ext rotation no pain with resisted int rotation, +empty can test. Strength and sensation grossly intact in all extremities, distal pulses intact.   Neurological: He is alert and oriented to person, place, and time. He has normal strength. No sensory deficit.  Skin: Skin is warm, dry and intact. No rash noted.  Psychiatric: He has a normal mood and affect.  Nursing note and vitals reviewed.    ED Treatments / Results  DIAGNOSTIC  STUDIES: Oxygen Saturation is 100% on RA, nl by my interpretation.    COORDINATION OF CARE: 9:16 PM Discussed treatment plan with pt at bedside which includes left shoulder xray, alternate ice or heat, follow up with orthopedist, and pt agreed to plan.   Radiology Dg Shoulder Left  Result Date: 09/16/2016 CLINICAL DATA:  Fall with injury, pain EXAM: LEFT SHOULDER - 2+ VIEW COMPARISON:  None. FINDINGS: There is no evidence of fracture or dislocation. There is no evidence of arthropathy or other focal bone abnormality. Soft tissues are unremarkable. IMPRESSION: Negative. Electronically Signed   By: Jasmine PangKim  Fujinaga M.D.   On: 09/16/2016 19:59    Procedures Procedures (including critical care time)  Medications Ordered in ED Medications - No data to display   Initial Impression / Assessment and Plan / ED Course  I have reviewed the triage vital signs and the nursing notes.  Pertinent imaging results that were available during my care of the patient were reviewed by me and considered in my medical decision making (see chart for details).  Clinical Course     35 y.o. male here with L shoulder pain after falling on outstretched arm 4 days ago. FROM intact, mild  tenderness to supraspinatus/muscles around shoulder. NVI with soft compartments. Xray in triage was neg. Likely rotator cuff injury/strain. Discussed that sling use at this point may cause more harm than good, discussed rest and ROM exercises, RICE/tylenol/motrin for pain, and f/up with ortho in 1-2wks for recheck of symptoms. I explained the diagnosis and have given explicit precautions to return to the ER including for any other new or worsening symptoms. The patient understands and accepts the medical plan as it's been dictated and I have answered their questions. Discharge instructions concerning home care and prescriptions have been given. The patient is STABLE and is discharged to home in good condition.   I personally performed  the services described in this documentation, which was scribed in my presence. The recorded information has been reviewed and is accurate.   Final Clinical Impressions(s) / ED Diagnoses   Final diagnoses:  Acute pain of left shoulder  Strain of left shoulder, initial encounter    New Prescriptions New Prescriptions   No medications on file     Allen DerryMercedes Camprubi-Soms, PA-C 09/16/16 2129    Alvira MondayErin Schlossman, MD 09/17/16 1217

## 2016-09-16 NOTE — ED Triage Notes (Signed)
pt c/o left shoulder injury x 5 days ago

## 2016-09-16 NOTE — Discharge Instructions (Signed)
Begin performing gentle range of motion exercises daily to help with your shoulder injury. Ice your shoulder throughout the day, using an ice pack for 20 minutes at a time every hour. Alternate between tylenol and motrin for pain relief. Call your orthopedist today or tomorrow to schedule followup appointment for recheck of ongoing shoulder pain in 1-2 weeks that can be canceled with a 24-48 hour notice if complete resolution of pain. Return to the ER for changes or worsening symptoms.

## 2017-05-01 ENCOUNTER — Emergency Department (HOSPITAL_BASED_OUTPATIENT_CLINIC_OR_DEPARTMENT_OTHER)
Admission: EM | Admit: 2017-05-01 | Discharge: 2017-05-01 | Disposition: A | Discharge status: Home / Self Care | Payer: 59 | Attending: Emergency Medicine | Admitting: Emergency Medicine

## 2017-05-01 ENCOUNTER — Encounter (HOSPITAL_BASED_OUTPATIENT_CLINIC_OR_DEPARTMENT_OTHER): Payer: Self-pay | Admitting: *Deleted

## 2017-05-01 DIAGNOSIS — J0110 Acute frontal sinusitis, unspecified: Secondary | ICD-10-CM | POA: Insufficient documentation

## 2017-05-01 DIAGNOSIS — Z79899 Other long term (current) drug therapy: Secondary | ICD-10-CM | POA: Insufficient documentation

## 2017-05-01 DIAGNOSIS — G501 Atypical facial pain: Secondary | ICD-10-CM | POA: Diagnosis present

## 2017-05-01 MED ORDER — AMOXICILLIN-POT CLAVULANATE 875-125 MG PO TABS: 1.0000 | ORAL_TABLET | Freq: Two times a day (BID) | ORAL | 0 refills | Status: DC

## 2017-05-01 NOTE — ED Triage Notes (Signed)
Facial pressure x 2. He has been taking Sudafed with some relief.

## 2017-05-01 NOTE — Discharge Instructions (Signed)
Continue decongestants, tylenol and ibuprofen for symptoms. This is likely a virus and can take 1-2 weeks to improve. Take antibiotics if symptoms doe not improve in 2 weeks  Return for worsening symptoms, including fevers, confusion, worsening pain, intractable vomiting or any other symptoms concerning to you.

## 2017-05-01 NOTE — ED Provider Notes (Signed)
[facility] Provider Note   CSN: [id number] Arrival date & time: 05/01/17  2029 By signing my name below, I, Levon HedgerElizabeth Hall, attest that this documentation has been prepared under the direction and in the presence of Bekah Igoe, Neysa Bonitoana Duo, MD . Electronically Signed: Levon HedgerElizabeth Hall, Scribe. 05/01/2017. 10:26 PM.   History   Chief Complaint Chief Complaint  Patient presents with  . Facial Pain   HPI Kenneth Glass is a 36 y.o. male who presents to the Emergency Department complaining of constant, moderate sinus pressure onset two days ago. He reports associated headache, congestion, nausea, vomiting, diarrhea, and fatigue. Pt has taken Sudafed and Advil with moderate relief. He expresses that this feels similar to prior sinusitis. Pt denies any postnasal drainage, cough, or fever.   The history is provided by the patient. No language interpreter was used.   Past Medical History:  Diagnosis Date  . Arthritis   . Gastric ulcer   . Hx of excision of mass     Patient Active Problem List   Diagnosis Date Noted  . S/P left TKA 11/07/2015  . S/P knee replacement 11/07/2015    Past Surgical History:  Procedure Laterality Date  . KNEE SURGERY    . KNEE SURGERY    . TOTAL KNEE ARTHROPLASTY Left 11/07/2015   Procedure: LEFT TOTAL KNEE ARTHROPLASTY;  Surgeon: [name], MD;  Location: [facility];  Service: Orthopedics;  Laterality: Left;    Home Medications    Prior to Admission medications   Medication Sig Start Date End Date Taking? Authorizing Provider  pseudoephedrine (SUDAFED) 120 MG 12 hr tablet Take 120 mg by mouth 2 (two) times daily.   Yes [provider]  amoxicillin-clavulanate (AUGMENTIN) 875-125 MG tablet Take 1 tablet by mouth every 12 (twelve) hours. 05/01/17   Lavera GuiseLiu, Gertrue Willette Duo, MD  Guaifenesin 1200 MG TB12 Take 1 tablet (1,200 mg total) by mouth 2 (two) times daily. 11/27/15   Lawyer, Cristal Deerhristopher, PA-C  methocarbamol (ROBAXIN) 500 MG tablet Take 1 tablet (500 mg  total) by mouth every 6 (six) hours as needed for muscle spasms. 11/08/15   Lanney GinsBabish, Matthew, PA-C  polyethylene glycol (MIRALAX / GLYCOLAX) packet Take 17 g by mouth 2 (two) times daily. 11/08/15   Lanney GinsBabish, Matthew, PA-C  predniSONE (DELTASONE) 50 MG tablet Take 1 tablet (50 mg total) by mouth daily with breakfast. 11/27/15   Charlestine NightLawyer, Christopher, PA-C    Family History No family history on file.  Social History Social History  Substance Use Topics  . Smoking status: Never Smoker  . Smokeless tobacco: Never Used  . Alcohol use No     Allergies   Shellfish allergy and Sulfa antibiotics   Review of Systems Review of Systems  Constitutional: Positive for fatigue. Negative for fever.  HENT: Positive for congestion, sinus pain and sinus pressure. Negative for postnasal drip.   Respiratory: Negative for cough.   Gastrointestinal: Positive for diarrhea, nausea and vomiting.  All other systems reviewed and are negative.  Physical Exam Updated Vital Signs BP 138/88   Pulse 72   Temp 98.3 F (36.8 C) (Oral)   Resp 20   Ht 6\' 2"  (1.88 m)   Wt 88.5 kg (195 lb)   SpO2 99%   BMI 25.04 kg/m   Physical Exam  Constitutional: He is oriented to person, place, and time. He appears well-developed and well-nourished. No distress.  HENT:  Head: Normocephalic and atraumatic.  Tenderness over right frontal sinus.   Eyes: Conjunctivae are normal.  Neck: Normal range of motion. Neck supple.  No nuchal rigidity   Cardiovascular: Normal rate.   Pulmonary/Chest: Effort normal.  Abdominal: He exhibits no distension.  Neurological: He is alert and oriented to person, place, and time.  Normal tone. Moves all extremities purposefully and symmetrically. PERRL, EOMI. Sensation in tact grossly throughout  Skin: Skin is warm and dry.  Nursing note and vitals reviewed.  ED Treatments / Results  DIAGNOSTIC STUDIES:  Oxygen Saturation is 99% on RA, normal by my interpretation.    COORDINATION OF  CARE:  10:24 PM Discussed treatment plan which includes OTC medication with pt at bedside and pt agreed to plan.   Labs (all labs ordered are listed, but only abnormal results are displayed) Labs Reviewed - No data to display  EKG  EKG Interpretation None       Radiology No results found.  Procedures Procedures (including critical care time)  Medications Ordered in ED Medications - No data to display   Initial Impression / Assessment and Plan / ED Course  I have reviewed the triage vital signs and the nursing notes.  Pertinent labs & imaging results that were available during my care of the patient were reviewed by me and considered in my medical decision making (see chart for details).     Presents with frontal headache and congestion over the past 2 days. States that symptoms are consistent with known history of sinusitis. He is afebrile, hemodynamically stable. Right frontal tenderness to palpation. No nuchal rigidity, fever, confusion. No concerns for meningitis. Likely viral sinusitis, discussed supportive management for 1-2 weeks. He does not have a PCP, who states he does not want to return for antibiotics if symptoms do not improve. Did prescribe Augmentin with instructions to take only if symptoms not improved after 2-3 weeks. Strict return and follow-up instructions reviewed. He expressed understanding of all discharge instructions and felt comfortable with the plan of care.   Final Clinical Impressions(s) / ED Diagnoses   Final diagnoses:  Acute non-recurrent frontal sinusitis    New Prescriptions New Prescriptions   AMOXICILLIN-CLAVULANATE (AUGMENTIN) 875-125 MG TABLET    Take 1 tablet by mouth every 12 (twelve) hours.   I personally performed the services described in this documentation, which was scribed in my presence. The recorded information has been reviewed and is accurate.    Lavera Guise, MD 05/01/17 [phone]

## 2018-01-13 ENCOUNTER — Encounter (HOSPITAL_BASED_OUTPATIENT_CLINIC_OR_DEPARTMENT_OTHER): Payer: Self-pay | Admitting: *Deleted

## 2018-01-13 ENCOUNTER — Other Ambulatory Visit: Payer: Self-pay

## 2018-01-13 DIAGNOSIS — Z96652 Presence of left artificial knee joint: Secondary | ICD-10-CM | POA: Insufficient documentation

## 2018-01-13 DIAGNOSIS — R072 Precordial pain: Secondary | ICD-10-CM | POA: Insufficient documentation

## 2018-01-13 DIAGNOSIS — Z79899 Other long term (current) drug therapy: Secondary | ICD-10-CM | POA: Insufficient documentation

## 2018-01-13 DIAGNOSIS — R51 Headache: Secondary | ICD-10-CM | POA: Insufficient documentation

## 2018-01-13 DIAGNOSIS — R079 Chest pain, unspecified: Secondary | ICD-10-CM | POA: Diagnosis not present

## 2018-01-13 NOTE — ED Triage Notes (Signed)
He used his epi pen 4 days ago after eating fish and not being able to breathe. He did not go to the doctor. He has had chest pain and headache x 3 days.

## 2018-01-14 ENCOUNTER — Emergency Department (HOSPITAL_BASED_OUTPATIENT_CLINIC_OR_DEPARTMENT_OTHER)
Admission: EM | Admit: 2018-01-14 | Discharge: 2018-01-14 | Disposition: A | Discharge status: Home / Self Care | Payer: 59 | Attending: Emergency Medicine | Admitting: Emergency Medicine

## 2018-01-14 ENCOUNTER — Emergency Department (HOSPITAL_BASED_OUTPATIENT_CLINIC_OR_DEPARTMENT_OTHER): Payer: 59

## 2018-01-14 DIAGNOSIS — R519 Headache, unspecified: Secondary | ICD-10-CM

## 2018-01-14 DIAGNOSIS — R079 Chest pain, unspecified: Secondary | ICD-10-CM | POA: Diagnosis not present

## 2018-01-14 DIAGNOSIS — R51 Headache: Secondary | ICD-10-CM

## 2018-01-14 DIAGNOSIS — R072 Precordial pain: Secondary | ICD-10-CM

## 2018-01-14 LAB — CBC WITH DIFFERENTIAL/PLATELET
BASOS PCT: 0 %
Basophils Absolute: 0 10*3/uL (ref 0.0–0.1)
EOS ABS: 0.2 10*3/uL (ref 0.0–0.7)
EOS PCT: 5 %
HEMATOCRIT: 37.6 % — AB (ref 39.0–52.0)
Hemoglobin: 13.6 g/dL (ref 13.0–17.0)
LYMPHS ABS: 1.1 10*3/uL (ref 0.7–4.0)
Lymphocytes Relative: 24 %
MCH: 31.1 pg (ref 26.0–34.0)
MCHC: 36.2 g/dL — ABNORMAL HIGH (ref 30.0–36.0)
MCV: 85.8 fL (ref 78.0–100.0)
MONOS PCT: 8 %
Monocytes Absolute: 0.4 10*3/uL (ref 0.1–1.0)
NEUTROS PCT: 63 %
Neutro Abs: 2.8 10*3/uL (ref 1.7–7.7)
PLATELETS: 239 10*3/uL (ref 150–400)
RBC: 4.38 MIL/uL (ref 4.22–5.81)
RDW: 13.8 % (ref 11.5–15.5)
WBC: 4.5 10*3/uL (ref 4.0–10.5)

## 2018-01-14 LAB — BASIC METABOLIC PANEL
ANION GAP: 9 (ref 5–15)
BUN: 8 mg/dL (ref 6–20)
CO2: 24 mmol/L (ref 22–32)
Calcium: 8.9 mg/dL (ref 8.9–10.3)
Chloride: 104 mmol/L (ref 101–111)
Creatinine, Ser: 0.71 mg/dL (ref 0.61–1.24)
Glucose, Bld: 99 mg/dL (ref 65–99)
POTASSIUM: 3.9 mmol/L (ref 3.5–5.1)
Sodium: 137 mmol/L (ref 135–145)

## 2018-01-14 LAB — TROPONIN I: Troponin I: <0.03 ng/mL (ref <0.03)

## 2018-01-14 MED ORDER — GI COCKTAIL ~~LOC~~
30.0000 mL | Freq: Once | ORAL | Status: AC
  Administered 2018-01-14: 30 mL via ORAL
  Filled 2018-01-14: qty 30

## 2018-01-14 MED ORDER — DIVALPROEX SODIUM ER 250 MG PO TB24
ORAL_TABLET | ORAL | Status: AC
  Administered 2018-01-14: 500 mg
  Filled 2018-01-14: qty 2

## 2018-01-14 MED ORDER — DIVALPROEX SODIUM 500 MG PO DR TAB
500.0000 mg | DELAYED_RELEASE_TABLET | Freq: Two times a day (BID) | ORAL | Status: DC
  Filled 2018-01-14: qty 1

## 2018-01-14 MED ORDER — DIPHENHYDRAMINE HCL 50 MG/ML IJ SOLN
25.0000 mg | Freq: Once | INTRAMUSCULAR | Status: AC
  Administered 2018-01-14: 25 mg via INTRAVENOUS
  Filled 2018-01-14: qty 1

## 2018-01-14 MED ORDER — DIVALPROEX SODIUM 500 MG PO DR TAB
500.0000 mg | DELAYED_RELEASE_TABLET | Freq: Once | ORAL | Status: DC
  Filled 2018-01-14: qty 1

## 2018-01-14 MED ORDER — KETOROLAC TROMETHAMINE 30 MG/ML IJ SOLN
15.0000 mg | Freq: Once | INTRAMUSCULAR | Status: AC
Start: 2018-01-14 — End: 2018-01-14
  Administered 2018-01-14: 15 mg via INTRAVENOUS
  Filled 2018-01-14: qty 1

## 2018-01-14 MED ORDER — METOCLOPRAMIDE HCL 5 MG/ML IJ SOLN
10.0000 mg | Freq: Once | INTRAMUSCULAR | Status: AC
  Administered 2018-01-14: 10 mg via INTRAVENOUS
  Filled 2018-01-14: qty 2

## 2018-01-14 MED ORDER — NAPROXEN 500 MG PO TABS: 500.0000 mg | ORAL_TABLET | Freq: Two times a day (BID) | ORAL | 0 refills | Status: DC

## 2018-01-14 MED ORDER — DEXAMETHASONE SODIUM PHOSPHATE 10 MG/ML IJ SOLN
10.0000 mg | Freq: Once | INTRAMUSCULAR | Status: DC
  Filled 2018-01-14: qty 1

## 2018-01-14 NOTE — ED Provider Notes (Signed)
[facility] EMERGENCY DEPARTMENT Provider Note   CSN: [id number] Arrival date & time: 01/13/18  2219     History   Chief Complaint Chief Complaint  Patient presents with  . Chest Pain    HPI Kenneth Glass is a 37 y.o. male.  The history is provided by the patient.  Chest Pain   This is a new problem. The current episode started more than 2 days ago. The problem occurs constantly. The problem has not changed since onset.The pain is associated with rest. The pain is present in the substernal region. The pain is moderate. The quality of the pain is described as dull. The pain does not radiate. Associated symptoms include headaches. Pertinent negatives include no abdominal pain, no diaphoresis, no dizziness, no fever, no hemoptysis, no leg pain, no lower extremity edema, no nausea, no near-syncope, no numbness, no orthopnea, no palpitations, no PND, no shortness of breath, no sputum production, no syncope and no weakness. He has tried nothing for the symptoms. The treatment provided no relief. Risk factors include male gender.  Pertinent negatives for past medical history include no MI, no seizures and no TIA.  Pertinent negatives for family medical history include: no Marfan's syndrome and no TIA.  Procedure history is negative for cardiac catheterization.  Also has a left occipital headache since using epi pen x 2 on Sunday for allergic reaction to jalapeno poppers.  Was not seen for the reaction.  No weakness or numbness no changes in vision speech cognition.  No neck pain.  Not sudden onset HA.    Past Medical History:  Diagnosis Date  . Arthritis   . Gastric ulcer   . Hx of excision of mass     Patient Active Problem List   Diagnosis Date Noted  . S/P left TKA 11/07/2015  . S/P knee replacement 11/07/2015    Past Surgical History:  Procedure Laterality Date  . KNEE SURGERY    . KNEE SURGERY    . TOTAL KNEE ARTHROPLASTY Left 11/07/2015   Procedure: LEFT TOTAL  KNEE ARTHROPLASTY;  Surgeon: Durene Romans, MD;  Location: [facility];  Service: Orthopedics;  Laterality: Left;        Home Medications    Prior to Admission medications   Medication Sig Start Date End Date Taking? Authorizing Provider  amoxicillin-clavulanate (AUGMENTIN) 875-125 MG tablet Take 1 tablet by mouth every 12 (twelve) hours. 05/01/17   Lavera Guise, MD  Guaifenesin 1200 MG TB12 Take 1 tablet (1,200 mg total) by mouth 2 (two) times daily. 11/27/15   Lawyer, Cristal Deer, PA-C  methocarbamol (ROBAXIN) 500 MG tablet Take 1 tablet (500 mg total) by mouth every 6 (six) hours as needed for muscle spasms. 11/08/15   Lanney Gins, PA-C  polyethylene glycol (MIRALAX / GLYCOLAX) packet Take 17 g by mouth 2 (two) times daily. 11/08/15   Lanney Gins, PA-C  predniSONE (DELTASONE) 50 MG tablet Take 1 tablet (50 mg total) by mouth daily with breakfast. 11/27/15   Lawyer, Cristal Deer, PA-C  pseudoephedrine (SUDAFED) 120 MG 12 hr tablet Take 120 mg by mouth 2 (two) times daily.    [provider]    Family History No family history on file.  Social History Social History   Tobacco Use  . Smoking status: Never Smoker  . Smokeless tobacco: Never Used  Substance Use Topics  . Alcohol use: No  . Drug use: No     Allergies   Shellfish allergy and Sulfa antibiotics   Review  of Systems Review of Systems  Constitutional: Negative for diaphoresis and fever.  HENT: Negative for drooling.   Eyes: Negative for photophobia, pain, redness and visual disturbance.  Respiratory: Negative for hemoptysis, sputum production, chest tightness and shortness of breath.   Cardiovascular: Positive for chest pain. Negative for palpitations, orthopnea, leg swelling, syncope, PND and near-syncope.  Gastrointestinal: Negative for abdominal pain and nausea.  Musculoskeletal: Negative for neck pain and neck stiffness.  Neurological: Positive for headaches. Negative for dizziness, tremors, seizures,  syncope, facial asymmetry, speech difficulty, weakness, light-headedness and numbness.  All other systems reviewed and are negative.    Physical Exam Updated Vital Signs BP (!) 144/97   Pulse 71   Temp 97.8 F (36.6 C) (Oral)   Resp 10   Ht 6\' 2"  (1.88 m)   Wt 90.7 kg (200 lb)   SpO2 99%   BMI 25.68 kg/m   Physical Exam  Constitutional: He is oriented to person, place, and time. He appears well-developed and well-nourished. No distress.  Well appearing sitting in room chatting comfortably with all the lights on  HENT:  Head: Normocephalic and atraumatic.  Nose: Nose normal.  Eyes: Pupils are equal, round, and reactive to light. Conjunctivae and EOM are normal.  No proptosis intact cognition  Neck: Normal range of motion. Neck supple. No JVD present.  Neck is non tender, no bruits  Cardiovascular: Normal rate, regular rhythm, normal heart sounds and intact distal pulses.  Pulmonary/Chest: Effort normal and breath sounds normal. No stridor. He has no wheezes. He has no rales.  Abdominal: Soft. Bowel sounds are normal. He exhibits no mass. There is no tenderness. There is no rebound and no guarding.  Musculoskeletal: Normal range of motion.  Lymphadenopathy:    He has no cervical adenopathy.  Neurological: He is alert and oriented to person, place, and time. He displays normal reflexes. No cranial nerve deficit or sensory deficit. He exhibits normal muscle tone. Coordination normal.  Skin: Skin is warm and dry. Capillary refill takes less than 2 seconds.  Psychiatric: Thought content normal.     ED Treatments / Results  Labs (all labs ordered are listed, but only abnormal results are displayed)  Results for orders placed or performed during the hospital encounter of 01/14/18  CBC with Differential/Platelet  Result Value Ref Range   WBC 4.5 4.0 - 10.5 K/uL   RBC 4.38 4.22 - 5.81 MIL/uL   Hemoglobin 13.6 13.0 - 17.0 g/dL   HCT 16.137.6 (L) 09.639.0 - 04.552.0 %   MCV 85.8 78.0 -  100.0 fL   MCH 31.1 26.0 - 34.0 pg   MCHC 36.2 (H) 30.0 - 36.0 g/dL   RDW 40.913.8 81.111.5 - 91.415.5 %   Platelets 239 150 - 400 K/uL   Neutrophils Relative % 63 %   Neutro Abs 2.8 1.7 - 7.7 K/uL   Lymphocytes Relative 24 %   Lymphs Abs 1.1 0.7 - 4.0 K/uL   Monocytes Relative 8 %   Monocytes Absolute 0.4 0.1 - 1.0 K/uL   Eosinophils Relative 5 %   Eosinophils Absolute 0.2 0.0 - 0.7 K/uL   Basophils Relative 0 %   Basophils Absolute 0.0 0.0 - 0.1 K/uL  Basic metabolic panel  Result Value Ref Range   Sodium 137 135 - 145 mmol/L   Potassium 3.9 3.5 - 5.1 mmol/L   Chloride 104 101 - 111 mmol/L   CO2 24 22 - 32 mmol/L   Glucose, Bld 99 65 - 99 mg/dL  BUN 8 6 - 20 mg/dL   Creatinine, Ser 8.11 0.61 - 1.24 mg/dL   Calcium 8.9 8.9 - 91.4 mg/dL   GFR calc non Af Amer >60 >60 mL/min   GFR calc Af Amer >60 >60 mL/min   Anion gap 9 5 - 15  Troponin I  Result Value Ref Range   Troponin I <0.03 <0.03 ng/mL   Dg Chest 2 View  Result Date: 01/14/2018 CLINICAL DATA:  37 year old male with chest pain. EXAM: CHEST - 2 VIEW COMPARISON:  Chest radiograph dated 07/15/2012 FINDINGS: The heart size and mediastinal contours are within normal limits. Both lungs are clear. The visualized skeletal structures are unremarkable. IMPRESSION: No active cardiopulmonary disease. Electronically Signed   By: Elgie Collard M.D.   On: 01/14/2018 01:34    EKG EKG Interpretation  Date/Time:  Tuesday January 13 2018 22:29:50 EDT Ventricular Rate:  65 PR Interval:  130 QRS Duration: 96 QT Interval:  398 QTC Calculation: 413 R Axis:   1 Text Interpretation:  Normal sinus rhythm Confirmed by Finley Dinkel ([id number]) on 01/14/2018 1:04:09 AM   Radiology Dg Chest 2 View  Result Date: 01/14/2018 CLINICAL DATA:  37 year old male with chest pain. EXAM: CHEST - 2 VIEW COMPARISON:  Chest radiograph dated 07/15/2012 FINDINGS: The heart size and mediastinal contours are within normal limits. Both lungs are clear. The visualized  skeletal structures are unremarkable. IMPRESSION: No active cardiopulmonary disease. Electronically Signed   By: Elgie Collard M.D.   On: 01/14/2018 01:34    Procedures Procedures (including critical care time)  Medications Ordered in ED Medications  divalproex (DEPAKOTE) DR tablet 500 mg (has no administration in time range)  gi cocktail (Maalox,Lidocaine,Donnatal) (30 mLs Oral Given 01/14/18 0201)  ketorolac (TORADOL) 30 MG/ML injection 15 mg (15 mg Intravenous Given 01/14/18 0217)  metoCLOPramide (REGLAN) injection 10 mg (10 mg Intravenous Given 01/14/18 0217)  diphenhydrAMINE (BENADRYL) injection 25 mg (25 mg Intravenous Given 01/14/18 0215)  diphenhydrAMINE (BENADRYL) injection 25 mg (25 mg Intravenous Given 01/14/18 0238)  divalproex (DEPAKOTE ER) 250 MG 24 hr tablet (500 mg  Given 01/14/18 0341)    PERC negative wells 0 highly doubt PE in this very low risk patient.  No signs of ICH, trauma or cavernouse sinus thrombosis on exam.  No indication  Final Clinical Impressions(s) / ED Diagnoses  Suspect medication effect from epi and some degree of anxiety.  Is pain free post medication.    Return for weakness, numbness, changes in vision or speech, fevers >100.4 unrelieved by medication, shortness of breath, intractable vomiting, or diarrhea, abdominal pain, Inability to tolerate liquids or food, cough, altered mental status or any concerns. No signs of systemic illness or infection. The patient is nontoxic-appearing on exam and vital signs are within normal limits.   I have reviewed the triage vital signs and the nursing notes. Pertinent labs &imaging results that were available during my care of the patient were reviewed by me and considered in my medical decision making (see chart for details).  After history, exam, and medical workup I feel the patient has been appropriately medically screened and is safe for discharge home. Pertinent diagnoses were discussed with the patient.  Patient was given return precautions.     Maui Britten, MD 01/14/18 [phone]

## 2018-01-14 NOTE — ED Notes (Signed)
Pt. Had a reaction to something on Friday morning [facility].  Pt. Reports he cough very hard and coughed up streaks of blood.  Pt. Used 2 Epi Pens for feeling like he could not breath.    This did did work for Pt. But he has had a headache on the L backside of his head and in the neck area since the episode.    Pt. Stated he had a slight episode of chest pain tonight when he was driving.  No chest pain at this time.    The main reason for visit is the headache.

## 2018-01-17 DIAGNOSIS — M25512 Pain in left shoulder: Secondary | ICD-10-CM | POA: Diagnosis not present

## 2018-01-22 NOTE — Progress Notes (Addendum)
Subjective:    Patient ID: Kenneth Glass, male    DOB: 09/22/1981, 37 y.o.   MRN: [id number]  HPI :  Mr. Kenneth Glass presents to establish as a new pt.  He is a pleasant 37 year old male.  PMH: Food Allergy- severe reaction to "some sort of food, seafood?" approx 2 weeks ago.  He needed to doses of epi-pen to reverse wheezing/shortness of breath. L Shoulder pain, complete jt replacement is plan- under care of Ortho/ Dr. Rennis ChrisSupple. L total knee replacement 10/2015 He recently started vegetarian diet > 4 weeks ago.  He estimates to drink > 100 oz water/day, denies tobacco/excessive ETOH use He moves as much as he can, limitation- shoulder pain He is married with 39 year old daughter.  Patient Care Team    Relationship Specialty Notifications Start End  Julaine Fusianford, Katy D, NP PCP - General Family Medicine  01/23/18     Patient Active Problem List   Diagnosis Date Noted  . S/P left TKA 11/07/2015  . S/P knee replacement 11/07/2015     Past Medical History:  Diagnosis Date  . Arthritis   . Gastric ulcer   . Hx of excision of mass      Past Surgical History:  Procedure Laterality Date  . KNEE SURGERY    . KNEE SURGERY    . TOTAL KNEE ARTHROPLASTY Left 11/07/2015   Procedure: LEFT TOTAL KNEE ARTHROPLASTY;  Surgeon: [name], MD;  Location: [facility];  Service: Orthopedics;  Laterality: Left;     Family History  Problem Relation Age of Onset  . Hypertension Mother   . Stroke Father   . Sickle cell anemia Father   . Diabetes Maternal Grandmother   . Hypertension Maternal Grandmother      Social History   Substance and Sexual Activity  Drug Use No     Social History   Substance and Sexual Activity  Alcohol Use Yes   Comment: socially     Social History   Tobacco Use  Smoking Status Never Smoker  Smokeless Tobacco Never Used     Outpatient Encounter Medications as of 01/23/2018  Medication Sig  . methocarbamol (ROBAXIN) 500 MG tablet Take 1 tablet (500 mg total) by  mouth every 6 (six) hours as needed for muscle spasms.  . naproxen (NAPROSYN) 500 MG tablet Take 1 tablet (500 mg total) by mouth 2 (two) times daily.  . [DISCONTINUED] amoxicillin-clavulanate (AUGMENTIN) 875-125 MG tablet Take 1 tablet by mouth every 12 (twelve) hours.  . [DISCONTINUED] Guaifenesin 1200 MG TB12 Take 1 tablet (1,200 mg total) by mouth 2 (two) times daily.  . [DISCONTINUED] polyethylene glycol (MIRALAX / GLYCOLAX) packet Take 17 g by mouth 2 (two) times daily.  . [DISCONTINUED] predniSONE (DELTASONE) 50 MG tablet Take 1 tablet (50 mg total) by mouth daily with breakfast.  . [DISCONTINUED] pseudoephedrine (SUDAFED) 120 MG 12 hr tablet Take 120 mg by mouth 2 (two) times daily.   No facility-administered encounter medications on file as of 01/23/2018.     Allergies: Shellfish allergy and Sulfa antibiotics  Body mass index is 25.57 kg/m.  Blood pressure 117/76, pulse 93, height 6' 0.75" (1.848 m), weight 192 lb 8 oz (87.3 kg), SpO2 98 %.     Review of Systems  Constitutional: Positive for fatigue. Negative for activity change, appetite change, chills, diaphoresis, fever and unexpected weight change.  HENT: Negative for congestion.   Eyes: Negative for visual disturbance.  Respiratory: Negative for cough, chest tightness, shortness of  breath, wheezing and stridor.   Cardiovascular: Negative for chest pain, palpitations and leg swelling.  Gastrointestinal: Negative for abdominal distention, abdominal pain, blood in stool, constipation, diarrhea, nausea and vomiting.  Endocrine: Negative for cold intolerance, heat intolerance, polydipsia, polyphagia and polyuria.  Musculoskeletal: Positive for arthralgias, myalgias and neck stiffness. Negative for back pain, gait problem, joint swelling and neck pain.  Neurological: Negative for dizziness and headaches.  Psychiatric/Behavioral: Positive for sleep disturbance.       Objective:   Physical Exam  Constitutional: He is  oriented to person, place, and time. He appears well-developed and well-nourished. No distress.  HENT:  Head: Normocephalic and atraumatic.  Right Ear: External ear normal.  Left Ear: External ear normal.  Cardiovascular: Normal rate, regular rhythm, normal heart sounds and intact distal pulses.  No murmur heard. Pulmonary/Chest: Effort normal and breath sounds normal. No respiratory distress. He has no wheezes. He has no rales. He exhibits no tenderness.  Musculoskeletal: He exhibits tenderness.       Left shoulder: He exhibits tenderness.  Neurological: He is alert and oriented to person, place, and time.  Skin: Skin is warm and dry. No rash noted. He is not diaphoretic. No erythema. No pallor.  Psychiatric: He has a normal mood and affect. His behavior is normal. Judgment and thought content normal.  Nursing note and vitals reviewed.     Assessment & Plan:   1. Healthcare maintenance   2. Food allergy   3. Status post left knee replacement   4. Chronic left shoulder pain   5. Insomnia, unspecified type     Healthcare maintenance Continue excellent hydration, vegetarian diet, and move as much as tolerated. Follow-up with Orthopedics as directed. Schedule complete physical with fasting labs this summer.  Food allergy Refill on Epi-pen sent in.  S/P knee replacement Jan 2017  Chronic left shoulder pain Followed by Dr. [name]  Insomnia Difficulty falling/remaining asleep since Jan 2017 [facility] Controlled Substance Database reviewed- recent narcotic for L shoulder pain He has been on Zolpidem 5mg  in past, reports good sx control and denies SE Practice good sleep hygiene If using Zolpidem, do not use any other medications- muscle relaxer or narcotic    FOLLOW-UP:  Return in about 3 months (around 04/24/2018) for CPE, Fasting Labs.

## 2018-01-23 ENCOUNTER — Encounter: Payer: Self-pay | Admitting: Adult Health

## 2018-01-23 ENCOUNTER — Ambulatory Visit (INDEPENDENT_AMBULATORY_CARE_PROVIDER_SITE_OTHER): Payer: 59 | Admitting: Adult Health

## 2018-01-23 ENCOUNTER — Other Ambulatory Visit: Payer: Self-pay | Admitting: Adult Health

## 2018-01-23 VITALS — BP 117/76 | HR 93 | Ht 72.75 in | Wt 192.5 lb

## 2018-01-23 DIAGNOSIS — Z91018 Allergy to other foods: Secondary | ICD-10-CM

## 2018-01-23 DIAGNOSIS — G8929 Other chronic pain: Secondary | ICD-10-CM | POA: Diagnosis not present

## 2018-01-23 DIAGNOSIS — Z96652 Presence of left artificial knee joint: Secondary | ICD-10-CM

## 2018-01-23 DIAGNOSIS — M25512 Pain in left shoulder: Secondary | ICD-10-CM

## 2018-01-23 DIAGNOSIS — G4700 Insomnia, unspecified: Secondary | ICD-10-CM

## 2018-01-23 DIAGNOSIS — Z0000 Encounter for general adult medical examination without abnormal findings: Secondary | ICD-10-CM

## 2018-01-23 DIAGNOSIS — M19012 Primary osteoarthritis, left shoulder: Secondary | ICD-10-CM | POA: Diagnosis not present

## 2018-01-23 MED ORDER — ZOLPIDEM TARTRATE 5 MG PO TABS: 5.0000 mg | ORAL_TABLET | Freq: Every evening | ORAL | 0 refills | Status: DC | PRN

## 2018-01-23 MED ORDER — EPINEPHRINE 0.3 MG/0.3ML IJ SOAJ: 0.3000 mg | Freq: Once | INTRAMUSCULAR | 6 refills | Status: AC

## 2018-01-23 NOTE — Assessment & Plan Note (Signed)
Continue excellent hydration, vegetarian diet, and move as much as tolerated. Follow-up with Orthopedics as directed. Schedule complete physical with fasting labs this summer.

## 2018-01-23 NOTE — Assessment & Plan Note (Signed)
[facility]iculty falling/remaining [facility]- recent narcotic for L shoulder pain He has been on Zolpidem 5mg  in past, reports good sx control and denies SE Practice good sleep hygiene If using Zolpidem, do not use any other medications- muscle relaxer or narcotic

## 2018-01-23 NOTE — Assessment & Plan Note (Signed)
Refill on Epi-pen sent in.

## 2018-01-23 NOTE — Assessment & Plan Note (Signed)
Jan 2017

## 2018-01-23 NOTE — Patient Instructions (Addendum)
Vegetarian Eating and Nutrition Vegetarian diets are designed for those people who prefer vegetarian eating for religious, ecologic, or personal reasons. These diets, which are often lower in cholesterol and saturated fats, can provide significant health benefits. They result in lower rates of obesity, diabetes, breast and colon cancers, and cardiovascular and gallbladder diseases. There are several types of vegetarian diets, but all restrict animal proteins to some degree. Because animal proteins have important nutrients, vegetarians should make sure to get these nutrients from other types of foods. The main purpose of healthy vegetarian eating is to provide the energy, vitamins, and minerals for proper growth and health maintenance at any age. What do I need to know about vegetarian eating and nutrition? The following nutrients are found in animal products. It is important to make sure you get enough of these nutrients from your diet. If you think you are not getting the right nutrients or if you do not eat any animal products, talk with your health care provider or dietitian about taking supplements. Protein Your dietitian can help you determine your individual protein needs. Sources of protein include:  Beans, such as black beans or kidney beans, or other legumes, such as lentils and split peas. Soy products. Nuts, such as almonds, [place] nuts, and pecans. Seeds, such as sunflower seeds. Tofu, tempeh, and hummus. Eggs, milk, and cheese.  Vitamin B12 This vitamin is only found in:  Cheeses, fish, and eggs. It can also be found in some breakfast cereals and other prepared products that have added vitamin B12. If you eat no animal products, you should discuss taking a supplement with your health care provider or dietitian.  Vitamin D Good sources of vitamin D include:  Cod liver oil. Fish, such as swordfish, salmon, and tuna. Dairy products. Orange juice. Fortified mushrooms. Cereals with added  vitamin D.  Iron Good sources of iron include:  Dark, leafy greens. Nuts. Beans. Grain products that have added iron, such as cereals. Tofu, tempeh, soybeans, and quinoa. Plant-based iron is better absorbed when eaten with vitamin C. For example, squeeze lemon juice over cooked greens like kale, chard, or spinach, or have a glass of orange juice with your meals.  Omega-3 Fatty Acids Good sources of omega-3 fatty acids include:  Walnuts. Foods with added omega-3 fatty acids, such as eggs, milk, and juices. Flax seeds, canola oil, soybean oil, and tofu. Fish (cold water fatty fish such as salmon, sardines, mackerel, herring, lake trout, and albacore tuna).  Calcium Good sources of calcium include:  Dark, leafy greens, such as kale, bok choy, Chinese cabbage, collard greens and mustard greens. Broccoli. Okra. Dairy products. Soy products with added calcium. Calcium-fortified breakfast cereals, calcium-fortified fruit juices, and figs.  Zinc Good sources of zinc include:  Legumes. Dairy products. Wheat germ, cereals, and breads that have added zinc. Baked beans. Legumes, such as cashews, chickpeas, kidney beans, and green peas. Almonds and nut butters. Tofu and other soy products.  This information is not intended to replace advice given to you by your health care provider. Make sure you discuss any questions you have with your health care provider. Document Released: 10/10/2003 Document Revised: 03/14/2016 Document Reviewed: 08/12/2013 Elsevier Interactive Patient Education  2017 [employer] Insomnia Insomnia is a sleep disorder that makes it difficult to fall asleep or to stay asleep. Insomnia can cause tiredness (fatigue), low energy, difficulty concentrating, mood swings, and poor performance at work or school. There are three different ways to classify insomnia:  Difficulty falling asleep.  Difficulty staying asleep.  Waking up too early in the morning.  Any type of insomnia  can be long-term (chronic) or short-term (acute). Both are common. Short-term insomnia usually lasts for three months or less. Chronic insomnia occurs at least three times a week for longer than three months. What are the causes? Insomnia may be caused by another condition, situation, or substance, such as:  Anxiety.  Certain medicines.  Gastroesophageal reflux disease (GERD) or other gastrointestinal conditions.  Asthma or other breathing conditions.  Restless legs syndrome, sleep apnea, or other sleep disorders.  Chronic pain.  Menopause. This may include hot flashes.  Stroke.  Abuse of alcohol, tobacco, or illegal drugs.  Depression.  Caffeine.  Neurological disorders, such as Alzheimer disease.  An overactive thyroid (hyperthyroidism).  The cause of insomnia may not be known. What increases the risk? Risk factors for insomnia include:  Gender. Women are more commonly affected than men.  Age. Insomnia is more common as you get older.  Stress. This may involve your professional or personal life.  Income. Insomnia is more common in people with lower income.  Lack of exercise.  Irregular work schedule or night shifts.  Traveling between different time zones.  What are the signs or symptoms? If you have insomnia, trouble falling asleep or trouble staying asleep is the main symptom. This may lead to other symptoms, such as:  Feeling fatigued.  Feeling nervous about going to sleep.  Not feeling rested in the morning.  Having trouble concentrating.  Feeling irritable, anxious, or depressed.  How is this treated? Treatment for insomnia depends on the cause. If your insomnia is caused by an underlying condition, treatment will focus on addressing the condition. Treatment may also include:  Medicines to help you sleep.  Counseling or therapy.  Lifestyle adjustments.  Follow these instructions at home:  Take medicines only as directed by your health  care provider.  Keep regular sleeping and waking hours. Avoid naps.  Keep a sleep diary to help you and your health care provider figure out what could be causing your insomnia. Include: ? When you sleep. ? When you wake up during the night. ? How well you sleep. ? How rested you feel the next day. ? Any side effects of medicines you are taking. ? What you eat and drink.  Make your bedroom a comfortable place where it is easy to fall asleep: ? Put up shades or special blackout curtains to block light from outside. ? Use a white noise machine to block noise. ? Keep the temperature cool.  Exercise regularly as directed by your health care provider. Avoid exercising right before bedtime.  Use relaxation techniques to manage stress. Ask your health care provider to suggest some techniques that may work well for you. These may include: ? Breathing exercises. ? Routines to release muscle tension. ? Visualizing peaceful scenes.  Cut back on alcohol, caffeinated beverages, and cigarettes, especially close to bedtime. These can disrupt your sleep.  Do not overeat or eat spicy foods right before bedtime. This can lead to digestive discomfort that can make it hard for you to sleep.  Limit screen use before bedtime. This includes: ? Watching TV. ? Using your smartphone, tablet, and computer.  Stick to a routine. This can help you fall asleep faster. Try to do a quiet activity, brush your teeth, and go to bed at the same time each night.  Get out of bed if you are still awake after 15 minutes of trying  to sleep. Keep the lights down, but try reading or doing a quiet activity. When you feel sleepy, go back to bed.  Make sure that you drive carefully. Avoid driving if you feel very sleepy.  Keep all follow-up appointments as directed by your health care provider. This is important. Contact a health care provider if:  You are tired throughout the day or have trouble in your daily routine due  to sleepiness.  You continue to have sleep problems or your sleep problems get worse. Get help right away if:  You have serious thoughts about hurting yourself or someone else. This information is not intended to replace advice given to you by your health care provider. Make sure you discuss any questions you have with your health care provider. Document Released: 10/04/2000 Document Revised: 03/08/2016 Document Reviewed: 07/08/2014 Elsevier Interactive Patient Education  2018 [employer]   Refill on Epi-pen sent in. Continue excellent hydration, vegetarian diet, and move as much as tolerated. Follow-up with Orthopedics as directed. Practice good sleep hygiene If using Zolpidem, do not use any other medications- muscle relaxer or narcotic Schedule complete physical with fasting labs this summer. WELCOME TO THE PRACTICE!

## 2018-01-23 NOTE — Assessment & Plan Note (Signed)
Followed by Dr. Francisca DecemberSupple/[facility]

## 2018-03-11 ENCOUNTER — Encounter (HOSPITAL_BASED_OUTPATIENT_CLINIC_OR_DEPARTMENT_OTHER): Payer: Self-pay

## 2018-03-11 ENCOUNTER — Emergency Department (HOSPITAL_BASED_OUTPATIENT_CLINIC_OR_DEPARTMENT_OTHER)
Admission: EM | Admit: 2018-03-11 | Discharge: 2018-03-12 | Disposition: A | Discharge status: Home / Self Care | Payer: 59 | Attending: Emergency Medicine | Admitting: Emergency Medicine

## 2018-03-11 ENCOUNTER — Other Ambulatory Visit: Payer: Self-pay

## 2018-03-11 ENCOUNTER — Emergency Department (HOSPITAL_BASED_OUTPATIENT_CLINIC_OR_DEPARTMENT_OTHER): Payer: 59

## 2018-03-11 DIAGNOSIS — Z96652 Presence of left artificial knee joint: Secondary | ICD-10-CM | POA: Insufficient documentation

## 2018-03-11 DIAGNOSIS — M7918 Myalgia, other site: Secondary | ICD-10-CM | POA: Diagnosis not present

## 2018-03-11 DIAGNOSIS — Y998 Other external cause status: Secondary | ICD-10-CM | POA: Diagnosis not present

## 2018-03-11 DIAGNOSIS — M791 Myalgia, unspecified site: Secondary | ICD-10-CM

## 2018-03-11 DIAGNOSIS — Y92410 Unspecified street and highway as the place of occurrence of the external cause: Secondary | ICD-10-CM | POA: Diagnosis not present

## 2018-03-11 DIAGNOSIS — M25561 Pain in right knee: Secondary | ICD-10-CM | POA: Insufficient documentation

## 2018-03-11 DIAGNOSIS — S8991XA Unspecified injury of right lower leg, initial encounter: Secondary | ICD-10-CM | POA: Diagnosis not present

## 2018-03-11 DIAGNOSIS — Y9389 Activity, other specified: Secondary | ICD-10-CM | POA: Insufficient documentation

## 2018-03-11 MED ORDER — NAPROXEN 500 MG PO TABS: 500.0000 mg | ORAL_TABLET | Freq: Two times a day (BID) | ORAL | 0 refills | Status: DC | PRN
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
## 2018-06-01 ENCOUNTER — Other Ambulatory Visit: Payer: Self-pay

## 2018-06-01 ENCOUNTER — Emergency Department (HOSPITAL_BASED_OUTPATIENT_CLINIC_OR_DEPARTMENT_OTHER): Payer: 59

## 2018-06-01 ENCOUNTER — Encounter (HOSPITAL_BASED_OUTPATIENT_CLINIC_OR_DEPARTMENT_OTHER): Payer: Self-pay | Admitting: *Deleted

## 2018-06-01 ENCOUNTER — Emergency Department (HOSPITAL_BASED_OUTPATIENT_CLINIC_OR_DEPARTMENT_OTHER)
Admission: EM | Admit: 2018-06-01 | Discharge: 2018-06-01 | Disposition: A | Discharge status: Home / Self Care | Payer: 59 | Attending: Emergency Medicine | Admitting: Emergency Medicine

## 2018-06-01 DIAGNOSIS — M25561 Pain in right knee: Secondary | ICD-10-CM | POA: Insufficient documentation

## 2018-06-01 DIAGNOSIS — M25531 Pain in right wrist: Secondary | ICD-10-CM | POA: Diagnosis not present

## 2018-06-01 DIAGNOSIS — Z79899 Other long term (current) drug therapy: Secondary | ICD-10-CM | POA: Insufficient documentation

## 2018-06-01 DIAGNOSIS — Z96652 Presence of left artificial knee joint: Secondary | ICD-10-CM | POA: Diagnosis not present

## 2018-06-01 DIAGNOSIS — S6991XA Unspecified injury of right wrist, hand and finger(s), initial encounter: Secondary | ICD-10-CM | POA: Diagnosis not present

## 2018-06-01 MED ORDER — ACETAMINOPHEN 500 MG PO TABS
1000.0000 mg | ORAL_TABLET | Freq: Once | ORAL | Status: AC
  Administered 2018-06-01: 1000 mg via ORAL
  Filled 2018-06-01: qty 2

## 2018-06-01 MED ORDER — IBUPROFEN 800 MG PO TABS
800.0000 mg | ORAL_TABLET | Freq: Once | ORAL | Status: AC
  Administered 2018-06-01: 800 mg via ORAL
  Filled 2018-06-01: qty 1
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
## 2018-06-01 NOTE — Discharge Instructions (Signed)
Follow up with your ortho doc, and PCP.    Take 4 over the counter ibuprofen tablets 3 times a day or 2 over-the-counter naproxen tablets twice a day for pain. Also take tylenol 1000mg (2 extra strength) four times a day.

## 2018-06-06 DIAGNOSIS — M25561 Pain in right knee: Secondary | ICD-10-CM | POA: Diagnosis not present

## 2018-06-06 DIAGNOSIS — M25531 Pain in right wrist: Secondary | ICD-10-CM | POA: Diagnosis not present

## 2018-11-02 DIAGNOSIS — M19012 Primary osteoarthritis, left shoulder: Secondary | ICD-10-CM | POA: Diagnosis not present

## 2018-11-02 DIAGNOSIS — M25512 Pain in left shoulder: Secondary | ICD-10-CM | POA: Diagnosis not present
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
## 2018-12-09 DIAGNOSIS — M19012 Primary osteoarthritis, left shoulder: Secondary | ICD-10-CM | POA: Diagnosis not present

## 2019-01-21 ENCOUNTER — Ambulatory Visit: Admit: 2019-01-21 | Payer: No Typology Code available for payment source | Admitting: Orthopedic Surgery

## 2019-01-21 SURGERY — ARTHROPLASTY, SHOULDER, TOTAL
Anesthesia: General | Laterality: Left

## 2019-03-29 ENCOUNTER — Encounter (HOSPITAL_BASED_OUTPATIENT_CLINIC_OR_DEPARTMENT_OTHER): Payer: Self-pay | Admitting: *Deleted

## 2019-03-29 ENCOUNTER — Other Ambulatory Visit: Payer: Self-pay

## 2019-03-29 ENCOUNTER — Emergency Department (HOSPITAL_BASED_OUTPATIENT_CLINIC_OR_DEPARTMENT_OTHER)
Admission: EM | Admit: 2019-03-29 | Discharge: 2019-03-29 | Disposition: A | Discharge status: Home / Self Care | Payer: BC Managed Care – PPO | Attending: Emergency Medicine | Admitting: Emergency Medicine

## 2019-03-29 DIAGNOSIS — Z96652 Presence of left artificial knee joint: Secondary | ICD-10-CM | POA: Insufficient documentation

## 2019-03-29 DIAGNOSIS — R1013 Epigastric pain: Secondary | ICD-10-CM | POA: Insufficient documentation

## 2019-03-29 DIAGNOSIS — K625 Hemorrhage of anus and rectum: Secondary | ICD-10-CM

## 2019-03-29 LAB — CBC WITH DIFFERENTIAL/PLATELET
Abs Immature Granulocytes: 0.01 10*3/uL (ref 0.00–0.07)
Basophils Absolute: 0 10*3/uL (ref 0.0–0.1)
Basophils Relative: 0 %
Eosinophils Absolute: 0.1 10*3/uL (ref 0.0–0.5)
Eosinophils Relative: 1 %
HCT: 37.9 % — ABNORMAL LOW (ref 39.0–52.0)
Hemoglobin: 13 g/dL (ref 13.0–17.0)
Immature Granulocytes: 0 %
Lymphocytes Relative: 22 %
Lymphs Abs: 1 10*3/uL (ref 0.7–4.0)
MCH: 30 pg (ref 26.0–34.0)
MCHC: 34.3 g/dL (ref 30.0–36.0)
MCV: 87.5 fL (ref 80.0–100.0)
Monocytes Absolute: 0.4 10*3/uL (ref 0.1–1.0)
Monocytes Relative: 9 %
Neutro Abs: 3 10*3/uL (ref 1.7–7.7)
Neutrophils Relative %: 68 %
Platelets: 240 10*3/uL (ref 150–400)
RBC: 4.33 MIL/uL (ref 4.22–5.81)
RDW: 13.3 % (ref 11.5–15.5)
WBC: 4.5 10*3/uL (ref 4.0–10.5)
nRBC: 0 % (ref 0.0–0.2)

## 2019-03-29 LAB — COMPREHENSIVE METABOLIC PANEL
ALT: 26 U/L (ref 0–44)
AST: 26 U/L (ref 15–41)
Albumin: 4.5 g/dL (ref 3.5–5.0)
Alkaline Phosphatase: 67 U/L (ref 38–126)
Anion gap: 8 (ref 5–15)
BUN: 6 mg/dL (ref 6–20)
CO2: 24 mmol/L (ref 22–32)
Calcium: 8.8 mg/dL — ABNORMAL LOW (ref 8.9–10.3)
Chloride: 105 mmol/L (ref 98–111)
Creatinine, Ser: 0.64 mg/dL (ref 0.61–1.24)
GFR calc Af Amer: >60 mL/min (ref >60)
GFR calc non Af Amer: >60 mL/min (ref >60)
Glucose, Bld: 106 mg/dL — ABNORMAL HIGH (ref 70–99)
Potassium: 3.5 mmol/L (ref 3.5–5.1)
Sodium: 137 mmol/L (ref 135–145)
Total Bilirubin: 1.6 mg/dL — ABNORMAL HIGH (ref 0.3–1.2)
Total Protein: 7.5 g/dL (ref 6.5–8.1)

## 2019-03-29 LAB — LIPASE, BLOOD: Lipase: 29 U/L (ref 11–51)

## 2019-03-29 NOTE — ED Provider Notes (Signed)
[facility] EMERGENCY DEPARTMENT Provider Note   CSN: [id number] Arrival date & time: 03/29/19  1833    History   Chief Complaint Chief Complaint  Patient presents with  . Rectal Bleeding    HPI Kenneth Glass is a 38 y.o. male.     38 yo M with a chief complaints of bright red blood per rectum.  This is been going on for about a week off and on.  Patient normally sees it in the bowl.  Denies dark or tarry stool.  Has had some epigastric abdominal pain that worsened with eating spicy food.  He denies a foreign body in his rectum.  Denies itching or burning at the rectum.  The history is provided by the patient.  Rectal Bleeding  Quality:  Bright red Amount:  Scant Duration:  1 week Timing:  Intermittent Chronicity:  New Context: not anal fissures and not anal penetration   Similar prior episodes: no   Relieved by:  Nothing Worsened by:  Nothing Ineffective treatments:  None tried Associated symptoms: abdominal pain (epigatric)   Associated symptoms: no fever and no vomiting     Past Medical History:  Diagnosis Date  . Arthritis   . Gastric ulcer   . Hx of excision of mass     Patient Active Problem List   Diagnosis Date Noted  . Healthcare maintenance 01/23/2018  . Food allergy 01/23/2018  . Chronic left shoulder pain 01/23/2018  . Insomnia 01/23/2018  . S/P left TKA 11/07/2015  . S/P knee replacement 11/07/2015    Past Surgical History:  Procedure Laterality Date  . KNEE SURGERY    . KNEE SURGERY    . TOTAL KNEE ARTHROPLASTY Left 11/07/2015   Procedure: LEFT TOTAL KNEE ARTHROPLASTY;  Surgeon: Paralee Cancel, MD;  Location: [facility];  Service: Orthopedics;  Laterality: Left;        Home Medications    Prior to Admission medications   Medication Sig Start Date End Date Taking? Authorizing Provider  methocarbamol (ROBAXIN) 500 MG tablet Take 1 tablet (500 mg total) by mouth every 6 (six) hours as needed for muscle spasms. 11/08/15   Danae Orleans, PA-C  naproxen (NAPROSYN) 500 MG tablet Take 1 tablet (500 mg total) by mouth 2 (two) times daily as needed. 03/11/18   Ward, Ozella Almond, PA-C  zolpidem (AMBIEN) 5 MG tablet Take 1 tablet (5 mg total) by mouth at bedtime as needed for sleep. 01/23/18   Esaw Grandchild, NP    Family History Family History  Problem Relation Age of Onset  . Hypertension Mother   . Stroke Father   . Sickle cell anemia Father   . Diabetes Maternal Grandmother   . Hypertension Maternal Grandmother     Social History Social History   Tobacco Use  . Smoking status: Never Smoker  . Smokeless tobacco: Never Used  Substance Use Topics  . Alcohol use: Yes    Comment: socially  . Drug use: No     Allergies   Shellfish allergy and Sulfa antibiotics   Review of Systems Review of Systems  Constitutional: Negative for chills and fever.  HENT: Negative for congestion and facial swelling.   Eyes: Negative for discharge and visual disturbance.  Respiratory: Negative for shortness of breath.   Cardiovascular: Negative for chest pain and palpitations.  Gastrointestinal: Positive for abdominal pain (epigatric), blood in stool and hematochezia. Negative for diarrhea and vomiting.  Musculoskeletal: Negative for arthralgias and myalgias.  Skin: Negative for  color change and rash.  Neurological: Negative for tremors, syncope and headaches.  Psychiatric/Behavioral: Negative for confusion and dysphoric mood.     Physical Exam Updated Vital Signs BP (!) 130/98 (BP Location: Right Arm)   Pulse 86   Temp 98.3 F (36.8 C) (Oral)   Resp 20   SpO2 99%   Physical Exam Vitals signs and nursing note reviewed.  Constitutional:      Appearance: He is well-developed.  HENT:     Head: Normocephalic and atraumatic.  Eyes:     Pupils: Pupils are equal, round, and reactive to light.  Neck:     Musculoskeletal: Normal range of motion and neck supple.     Vascular: No JVD.  Cardiovascular:     Rate and  Rhythm: Normal rate and regular rhythm.     Heart sounds: No murmur. No friction rub. No gallop.   Pulmonary:     Effort: No respiratory distress.     Breath sounds: No wheezing.  Abdominal:     General: There is no distension.     Tenderness: There is no abdominal tenderness. There is no guarding or rebound.  Genitourinary:    Comments: Erosions around the rectum.  Worse to the 6 o'clock position, no appreciable hemorrhoids no gross blood Musculoskeletal: Normal range of motion.  Skin:    Coloration: Skin is not pale.     Findings: No rash.  Neurological:     Mental Status: He is alert and oriented to person, place, and time.  Psychiatric:        Behavior: Behavior normal.      ED Treatments / Results  Labs (all labs ordered are listed, but only abnormal results are displayed) Labs Reviewed  CBC WITH DIFFERENTIAL/PLATELET - Abnormal; Notable for the following components:      Result Value   HCT 37.9 (*)    All other components within normal limits  COMPREHENSIVE METABOLIC PANEL - Abnormal; Notable for the following components:   Glucose, Bld 106 (*)    Calcium 8.8 (*)    Total Bilirubin 1.6 (*)    All other components within normal limits  LIPASE, BLOOD    EKG None  Radiology No results found.  Procedures Procedures (including critical care time)  Medications Ordered in ED Medications - No data to display   Initial Impression / Assessment and Plan / ED Course  I have reviewed the triage vital signs and the nursing notes.  Pertinent labs & imaging results that were available during my care of the patient were reviewed by me and considered in my medical decision making (see chart for details).        38 yo M with a chief complaint of bright red blood per rectum.  There is associated with epigastric abdominal pain.  Rectal exam he has some erosions to the mucosa which could be the cause of the bleeding.  We will have him take a stool softener and sitz bath's.   With his GI bleeding will refer him to GI, he has seen them before and been diagnosed with gastric ulcers in the past.  Hemoglobin is normal.  LFTs and lipase are unremarkable, unlikely to be pancreatitis or hepatitis.  We will have the patient do a trial of Zantac or Pepcid have him take a stool softener.  PCP and GI follow-up.  8:26 PM:  I have discussed the diagnosis/risks/treatment options with the patient and believe the pt to be eligible for discharge home to follow-up with  PCP, GI. We also discussed returning to the ED immediately if new or worsening sx occur. We discussed the sx which are most concerning (e.g., sudden worsening pain, fever, inability to tolerate by mouth) that necessitate immediate return. Medications administered to the patient during their visit and any new prescriptions provided to the patient are listed below.  Medications given during this visit Medications - No data to display   The patient appears reasonably screen and/or stabilized for discharge and I doubt any other medical condition or other [facility] requiring further screening, evaluation, or treatment in the ED at this time prior to discharge.    Final Clinical Impressions(s) / ED Diagnoses   Final diagnoses:  Rectal bleeding    ED Discharge Orders    None       Melene PlanFloyd, Jamiaya Bina, DO 03/29/19 2026

## 2019-03-29 NOTE — ED Notes (Signed)
C/O bright red blood with stools only. For 1 weeks. No pain. Denies emesis

## 2019-03-29 NOTE — Discharge Instructions (Signed)
The amount of blood in your body is normal.  You have an erosion around your rectum.  Please try and take a stool softener such as Colace, or you can try something like MiraLAX.  Try to drink lots of fluids.  After he have a bowel movements please use a shower want to clean the area or sit in a warm tub.  With your history of prior ulcers in the stomach and this new bleeding out of your bottom this is something that could be evaluated by gastroenterologist.

## 2019-03-29 NOTE — ED Notes (Signed)
ED Provider at bedside. 

## 2019-03-29 NOTE — ED Triage Notes (Signed)
Rectal bleeding, bright red blood, onset 1 week ago

## 2019-09-25 DIAGNOSIS — Z20828 Contact with and (suspected) exposure to other viral communicable diseases: Secondary | ICD-10-CM | POA: Diagnosis not present

## 2020-05-15 ENCOUNTER — Encounter: Payer: Self-pay | Admitting: Emergency Medicine

## 2020-05-15 ENCOUNTER — Ambulatory Visit: Payer: BC Managed Care – PPO

## 2020-05-15 ENCOUNTER — Ambulatory Visit
Admission: EM | Admit: 2020-05-15 | Discharge: 2020-05-15 | Disposition: A | Discharge status: Home / Self Care | Payer: BC Managed Care – PPO

## 2020-05-15 ENCOUNTER — Other Ambulatory Visit: Payer: Self-pay

## 2020-05-15 DIAGNOSIS — M25561 Pain in right knee: Secondary | ICD-10-CM

## 2020-05-15 NOTE — Discharge Instructions (Addendum)
Recommend continue rest, ice, elevate.  Ace wrap for compression and swelling. You can follow-up with [facility] this [facility].  They do have an urgent care that is open 8 AM to 8 PM Monday through Friday Also you can follow-up with sports med

## 2020-05-15 NOTE — ED Triage Notes (Signed)
Pt presents to [facility] for assessment of right knee pain after he was playing basketball Thursday and fell.  Patient states he is unsure of exactly what happened.  Patient has been trying ice, ace bandage and rest since.

## 2020-05-15 NOTE — ED Provider Notes (Signed)
[facility]    CSN: [id number] Arrival date & time: 05/15/20  1746      History   Chief Complaint Chief Complaint  Patient presents with  . Knee Pain    HPI Kenneth Glass is a 39 y.o. male.   Pt is an overall healthy 39 year old male presenting with right knee swelling since Thursday. Symptoms began after fall while playing basketball. Mildly alleviated by rest and ice. Denies numbness, tingling, alterations in sensation, pain. Hx L total knee replacement.  ROS per HPI      Past Medical History:  Diagnosis Date  . Arthritis   . Gastric ulcer   . Hx of excision of mass     Patient Active Problem List   Diagnosis Date Noted  . Healthcare maintenance 01/23/2018  . Food allergy 01/23/2018  . Chronic left shoulder pain 01/23/2018  . Insomnia 01/23/2018  . S/P left TKA 11/07/2015  . S/P knee replacement 11/07/2015    Past Surgical History:  Procedure Laterality Date  . KNEE SURGERY    . KNEE SURGERY    . TOTAL KNEE ARTHROPLASTY Left 11/07/2015   Procedure: LEFT TOTAL KNEE ARTHROPLASTY;  Surgeon: Durene Romans, MD;  Location: [facility];  Service: Orthopedics;  Laterality: Left;       Home Medications    Prior to Admission medications   Medication Sig Start Date End Date Taking? Authorizing Provider  zolpidem (AMBIEN) 5 MG tablet Take 1 tablet (5 mg total) by mouth at bedtime as needed for sleep. 01/23/18 05/15/20  Julaine Fusi, NP    Family History Family History  Problem Relation Age of Onset  . Hypertension Mother   . Stroke Father   . Sickle cell anemia Father   . Diabetes Maternal Grandmother   . Hypertension Maternal Grandmother     Social History Social History   Tobacco Use  . Smoking status: Never Smoker  . Smokeless tobacco: Never Used  Vaping Use  . Vaping Use: Never used  Substance Use Topics  . Alcohol use: Yes    Comment: socially  . Drug use: No     Allergies   Shellfish allergy and Sulfa antibiotics   Review of  Systems Review of Systems  Constitutional: Negative.   Musculoskeletal: Positive for joint swelling.     Physical Exam Triage Vital Signs ED Triage Vitals [05/15/20 1819]  Enc Vitals Group     BP (!) 146/84     Pulse Rate 68     Resp 16     Temp 98.6 F (37 C)     Temp Source Oral     SpO2 98 %     Weight      Height      Head Circumference      Peak Flow      Pain Score 3     Pain Loc      Pain Edu?      Excl. in GC?    No data found.  Updated Vital Signs BP (!) 146/84 (BP Location: Left Arm)   Pulse 68   Temp 98.6 F (37 C) (Oral)   Resp 16   SpO2 98%   Visual Acuity Right Eye Distance:   Left Eye Distance:   Bilateral Distance:    Right Eye Near:   Left Eye Near:    Bilateral Near:     Physical Exam Constitutional:      Appearance: Normal appearance. He is normal weight.  Musculoskeletal:  Right knee: Swelling and effusion present. Decreased range of motion. No tenderness. No LCL laxity, MCL laxity, ACL laxity or PCL laxity.     Left knee: Normal.       Legs:  Neurological:     Mental Status: He is alert.     GCS: GCS eye subscore is 4. GCS verbal subscore is 5. GCS motor subscore is 6.     Cranial Nerves: Cranial nerves are intact.     Sensory: Sensation is intact.     Motor: Motor function is intact.     Coordination: Coordination is intact.     Gait: Gait is intact.      UC Treatments / Results  Labs (all labs ordered are listed, but only abnormal results are displayed) Labs Reviewed - No data to display  EKG   Radiology No results found.  Procedures Procedures (including critical care time)  Medications Ordered in UC Medications - No data to display  Initial Impression / Assessment and Plan / UC Course  I have reviewed the triage vital signs and the nursing notes.  Pertinent labs & imaging results that were available during my care of the patient were reviewed by me and considered in my medical decision making (see chart  for details).     Acute pain  Of the right knee No current pain but mild swelling Most likely inflammation from the injury.  Doubt any ligamentous injury or bony abnormalities. Deferring x-ray today.  We will have him continue to rest, ice, elevate.  Ace wrap for compression Recommend following up with [facility] this [facility]  Final Clinical Impressions(s) / UC Diagnoses   Final diagnoses:  Acute pain of right knee     Discharge Instructions     Recommend continue rest, ice, elevate.  Ace wrap for compression and swelling. You can follow-up with [facility] this week.  They do have an urgent care that is open 8 AM to 8 PM Monday through Friday Also you can follow-up with sports med     ED Prescriptions    None     PDMP not reviewed this encounter.   Dahlia Byes A, NP 05/16/20 1506

## 2020-05-22 ENCOUNTER — Other Ambulatory Visit: Payer: Self-pay

## 2020-05-22 ENCOUNTER — Ambulatory Visit (INDEPENDENT_AMBULATORY_CARE_PROVIDER_SITE_OTHER): Payer: BC Managed Care – PPO | Admitting: Family Medicine

## 2020-05-22 VITALS — BP 120/84 | Ht 73.5 in | Wt 180.0 lb

## 2020-05-22 DIAGNOSIS — M25561 Pain in right knee: Secondary | ICD-10-CM | POA: Diagnosis not present

## 2020-05-22 MED ORDER — METHYLPREDNISOLONE ACETATE 40 MG/ML IJ SUSP
40.0000 mg | Freq: Once | INTRAMUSCULAR | Status: AC
  Administered 2020-05-22: 40 mg via INTRA_ARTICULAR

## 2020-05-22 NOTE — Patient Instructions (Signed)
Your pain is due to arthritis and the large effusion (swelling). These are the different medications you can take for this: Tylenol 500mg  1-2 tabs three times a day for pain. Capsaicin, aspercreme, or biofreeze topically up to four times a day may also help with pain. Some supplements that may help for arthritis: Boswellia extract, curcumin, pycnogenol Aleve 1-2 tabs twice a day with food Cortisone injections are an option - you were given this today after draining your knee. Compression sleeve is helpful as well. It's important that you continue to stay active. Straight leg raises, knee extensions 3 sets of 10 once a day (add ankle weight if these become too easy). Consider physical therapy to strengthen muscles around the joint that hurts to take pressure off of the joint itself. Ice 15 minutes at a time 3-4 times a day as needed to help with pain. Water aerobics and cycling with low resistance are the best two types of exercise for arthritis though any exercise is ok as long as it doesn't worsen the pain. Follow up with me in 1 month or as needed if you're doing well.

## 2020-05-22 NOTE — Progress Notes (Signed)
PCP: Patient, No Pcp Per  Subjective:   HPI: Patient is a 39 y.o. male here for evaluation of right knee swelling. States he noticed the swelling on 7/22, a day after he was playing basketball and fell. He did not fall on his knee and says he might have twisted it while falling but does not remember clearly. He denies any pain during the incident or since then. He has been icing and elevating his leg and states the swelling has gone down a lot since it first started. The swelling is worse when he stands or sits for long periods of time. When it swells a lot, he notices he has limited flexion and the back of his knee feels tight with full extension.  Of note has had his left knee replaced due to arthritis about 4.5 years ago.  Past Medical History:  Diagnosis Date  . Arthritis   . Gastric ulcer   . Hx of excision of mass     Current Outpatient Medications on File Prior to Visit  Medication Sig Dispense Refill  . [DISCONTINUED] zolpidem (AMBIEN) 5 MG tablet Take 1 tablet (5 mg total) by mouth at bedtime as needed for sleep. 30 tablet 0   No current facility-administered medications on file prior to visit.    Past Surgical History:  Procedure Laterality Date  . KNEE SURGERY    . KNEE SURGERY    . TOTAL KNEE ARTHROPLASTY Left 11/07/2015   Procedure: LEFT TOTAL KNEE ARTHROPLASTY;  Surgeon: Durene Romans, MD;  Location: [facility];  Service: Orthopedics;  Laterality: Left;    Allergies  Allergen Reactions  . Shellfish Allergy Shortness Of Breath  . Sulfa Antibiotics     Childhood; not sure of rxn    Social History   Socioeconomic History  . Marital status: Married    Spouse name: Not on file  . Number of children: Not on file  . Years of education: Not on file  . Highest education level: Not on file  Occupational History  . Not on file  Tobacco Use  . Smoking status: Never Smoker  . Smokeless tobacco: Never Used  Vaping Use  . Vaping Use: Never used  Substance and Sexual  Activity  . Alcohol use: Yes    Comment: socially  . Drug use: No  . Sexual activity: Not on file  Other Topics Concern  . Not on file  Social History Narrative  . Not on file   Social Determinants of Health   Financial Resource Strain:   . Difficulty of Paying Living Expenses:   Food Insecurity:   . Worried About Programme researcher, broadcasting/film/video in the Last Year:   . Barista in the Last Year:   Transportation Needs:   . Freight forwarder (Medical):   Marland Kitchen Lack of Transportation (Non-Medical):   Physical Activity:   . Days of Exercise per Week:   . Minutes of Exercise per Session:   Stress:   . Feeling of Stress :   Social Connections:   . Frequency of Communication with Friends and Family:   . Frequency of Social Gatherings with Friends and Family:   . Attends Religious Services:   . Active Member of Clubs or Organizations:   . Attends Banker Meetings:   Marland Kitchen Marital Status:   Intimate Partner Violence:   . Fear of Current or Ex-Partner:   . Emotionally Abused:   Marland Kitchen Physically Abused:   . Sexually Abused:  Family History  Problem Relation Age of Onset  . Hypertension Mother   . Stroke Father   . Sickle cell anemia Father   . Diabetes Maternal Grandmother   . Hypertension Maternal Grandmother     BP 120/84   Ht 6' 1.5" (1.867 m)   Wt 180 lb (81.6 kg)   BMI 23.43 kg/m   Review of Systems: See HPI above.     Objective:  Physical Exam:  Gen: NAD, comfortable in exam room Lungs: Breathing comfortably on room air  Right Knee: -Inspection: moderate effusion.  No bruising, other deformity. -Palpation: no tenderness to palpation joint lines, post patellar facets.  Fluid wave. -ROM: 0 - 120 degrees, tight and painful on full flexion -Special Tests: negative anterior/poster drawer, negative varus and valgus stress testing, negative McMurrays, negative Thessaly -Limb neurovascularly intact, no instability noted   Assessment & Plan:  1. Right Knee  Pain: Secondary to arthritis with large effusion.  Brief musculoskeletal ultrasound showed only a small Baker's cyst but the large effusion.  Mild to moderate arthropathy at least medial compartment.  We discussed Tylenol, topical medications, supplements, Aleve, home exercises.  He opted for aspiration and injection of his knee as well.  Compression sleeve, icing.  Follow-up in 1 month or as needed if doing well.  After informed written consent timeout was performed, patient was lying supine on exam table.  Right knee was prepped with alcohol swab.  Utilizing superolateral approach, 3 mL of bupivicaine was used for local anesthesia.  Then using an 18g needle on 60cc syringe with ultrasound guidance, 31 mL of blood-tinged straw-colored fluid was aspirated from right knee.  Knee was then injected with 3:1 bupivicaine:depomedrol.  Patient tolerated procedure well without immediate complications.

## 2020-05-23 ENCOUNTER — Encounter: Payer: Self-pay | Admitting: Family Medicine

## 2020-07-12 ENCOUNTER — Other Ambulatory Visit: Payer: Self-pay

## 2020-07-12 ENCOUNTER — Ambulatory Visit
Admission: EM | Admit: 2020-07-12 | Discharge: 2020-07-12 | Discharge status: Absent without leave | Payer: BC Managed Care – PPO

## 2020-07-12 ENCOUNTER — Ambulatory Visit
Admission: RE | Admit: 2020-07-12 | Discharge: 2020-07-12 | Disposition: A | Discharge status: Home / Self Care | Payer: BC Managed Care – PPO | Source: Ambulatory Visit | Attending: Emergency Medicine | Admitting: Emergency Medicine

## 2020-07-12 VITALS — BP 144/83 | HR 83 | Temp 98.5°F | Resp 16

## 2020-07-12 DIAGNOSIS — Z1152 Encounter for screening for COVID-19: Secondary | ICD-10-CM

## 2020-07-12 NOTE — Discharge Instructions (Signed)

## 2020-07-12 NOTE — ED Triage Notes (Signed)
Pt is required a covid test for work purposes. Pt has no signs or symptoms.

## 2020-07-15 LAB — NOVEL CORONAVIRUS, NAA: SARS-CoV-2, NAA: NOT DETECTED

## 2020-09-06 DIAGNOSIS — Z1159 Encounter for screening for other viral diseases: Secondary | ICD-10-CM | POA: Diagnosis not present

## 2020-10-07 ENCOUNTER — Other Ambulatory Visit: Payer: Self-pay

## 2020-10-07 ENCOUNTER — Encounter (HOSPITAL_BASED_OUTPATIENT_CLINIC_OR_DEPARTMENT_OTHER): Payer: Self-pay | Admitting: Emergency Medicine

## 2020-10-07 ENCOUNTER — Emergency Department (HOSPITAL_BASED_OUTPATIENT_CLINIC_OR_DEPARTMENT_OTHER)
Admission: EM | Admit: 2020-10-07 | Discharge: 2020-10-07 | Disposition: A | Discharge status: Home / Self Care | Payer: BC Managed Care – PPO | Attending: Emergency Medicine | Admitting: Emergency Medicine

## 2020-10-07 DIAGNOSIS — H5319 Other subjective visual disturbances: Secondary | ICD-10-CM | POA: Diagnosis not present

## 2020-10-07 DIAGNOSIS — U071 COVID-19: Secondary | ICD-10-CM | POA: Insufficient documentation

## 2020-10-07 DIAGNOSIS — R519 Headache, unspecified: Secondary | ICD-10-CM | POA: Diagnosis not present

## 2020-10-07 DIAGNOSIS — Z96652 Presence of left artificial knee joint: Secondary | ICD-10-CM | POA: Insufficient documentation

## 2020-10-07 LAB — RESP PANEL BY RT-PCR (FLU A&B, COVID) ARPGX2
Influenza A by PCR: NEGATIVE
Influenza B by PCR: NEGATIVE
SARS Coronavirus 2 by RT PCR: POSITIVE — AB

## 2020-10-07 MED ORDER — FLUTICASONE PROPIONATE 50 MCG/ACT NA SUSP
1.0000 | Freq: Every day | NASAL | Status: DC
  Filled 2020-10-07: qty 16

## 2020-10-07 MED ORDER — KETOROLAC TROMETHAMINE 30 MG/ML IJ SOLN
30.0000 mg | Freq: Once | INTRAMUSCULAR | Status: AC
  Administered 2020-10-07: 30 mg via INTRAMUSCULAR
  Filled 2020-10-07: qty 1

## 2020-10-07 MED ORDER — BENZONATATE 100 MG PO CAPS: 100.0000 mg | ORAL_CAPSULE | Freq: Three times a day (TID) | ORAL | 0 refills | Status: AC

## 2020-10-07 MED ORDER — ACETAMINOPHEN 500 MG PO TABS
1000.0000 mg | ORAL_TABLET | Freq: Once | ORAL | Status: AC
  Administered 2020-10-07: 1000 mg via ORAL
  Filled 2020-10-07: qty 2

## 2020-10-07 NOTE — ED Notes (Signed)
PA and Primary RN Hansel Starling) notified of pts positive Covid result. Precautions in place.

## 2020-10-07 NOTE — ED Provider Notes (Signed)
[facility] EMERGENCY DEPARTMENT Provider Note   CSN: [id number] Arrival date & time: 10/07/20  1647     History Chief Complaint  Patient presents with  . Headache    Kenneth Glass is a 39 y.o. male.  HPI   Pt is a 39 y/o male who presents to the ED today for eval of a headache. HA located to the frontal aspect of the head. Describes pain as a pressure that feels similar to prior sinus headaches. Reports associated photophobia. Denies vision changes, speech problems, numbness/weakness.  He is also c/o a cough, sweats, fevers, fatigue, diarrhea, rhinorrhea, congestion, and also had some post tussive emesis. He has had no further episodes.   States he was recently around his grandson who had a virus. He states that his grandson did not have COVID though.  He had not had his COVID vaccine. He has tried taking sudafed, robitussin and advil. States that nothing improved his symptoms.   Past Medical History:  Diagnosis Date  . Arthritis   . Gastric ulcer   . Hx of excision of mass     Patient Active Problem List   Diagnosis Date Noted  . Healthcare maintenance 01/23/2018  . Food allergy 01/23/2018  . Chronic left shoulder pain 01/23/2018  . Insomnia 01/23/2018  . S/P left TKA 11/07/2015  . S/P knee replacement 11/07/2015    Past Surgical History:  Procedure Laterality Date  . KNEE SURGERY    . KNEE SURGERY    . TOTAL KNEE ARTHROPLASTY Left 11/07/2015   Procedure: LEFT TOTAL KNEE ARTHROPLASTY;  Surgeon: Durene Romans, MD;  Location: [facility];  Service: Orthopedics;  Laterality: Left;       Family History  Problem Relation Age of Onset  . Hypertension Mother   . Stroke Father   . Sickle cell anemia Father   . Diabetes Maternal Grandmother   . Hypertension Maternal Grandmother     Social History   Tobacco Use  . Smoking status: Never Smoker  . Smokeless tobacco: Never Used  Vaping Use  . Vaping Use: Never used  Substance Use Topics  . Alcohol use:  Yes    Comment: socially  . Drug use: No    Home Medications Prior to Admission medications   Medication Sig Start Date End Date Taking? Authorizing Provider  benzonatate (TESSALON) 100 MG capsule Take 1 capsule (100 mg total) by mouth every 8 (eight) hours for 5 days. 10/07/20 10/12/20  Rolla Kedzierski S, PA-C  zolpidem (AMBIEN) 5 MG tablet Take 1 tablet (5 mg total) by mouth at bedtime as needed for sleep. 01/23/18 05/15/20  William Hamburger D, NP    Allergies    Shellfish allergy and Sulfa antibiotics  Review of Systems   Review of Systems  Constitutional: Positive for chills, diaphoresis and fever.  HENT: Positive for congestion, rhinorrhea, sinus pressure and sinus pain. Negative for ear pain and sore throat.   Eyes: Negative for pain and visual disturbance.  Respiratory: Positive for cough. Negative for shortness of breath.   Cardiovascular: Negative for chest pain.  Gastrointestinal: Positive for nausea and vomiting. Negative for abdominal pain, constipation and diarrhea.  Genitourinary: Negative for dysuria and hematuria.  Musculoskeletal: Negative for back pain.  Skin: Negative for color change and rash.  Neurological: Positive for headaches. Negative for weakness and numbness.  All other systems reviewed and are negative.   Physical Exam Updated Vital Signs BP 119/69   Pulse 79   Temp 98.4 F (36.9 C) (  Oral)   Resp 18   Ht 6\' 2"  (1.88 m)   Wt 79.4 kg   SpO2 100%   BMI 22.47 kg/m   Physical Exam Vitals and nursing note reviewed.  Constitutional:      Appearance: He is well-developed and well-nourished.  HENT:     Head: Normocephalic and atraumatic.  Eyes:     Conjunctiva/sclera: Conjunctivae normal.  Cardiovascular:     Rate and Rhythm: Normal rate and regular rhythm.     Heart sounds: Normal heart sounds. No murmur heard.   Pulmonary:     Effort: Pulmonary effort is normal. No respiratory distress.     Breath sounds: Normal breath sounds. No wheezing,  rhonchi or rales.  Abdominal:     General: Bowel sounds are normal.     Palpations: Abdomen is soft.     Tenderness: There is no abdominal tenderness.  Musculoskeletal:        General: No edema.     Cervical back: Neck supple.  Skin:    General: Skin is warm and dry.  Neurological:     Mental Status: He is alert.     Comments: Mental Status:  Alert, thought content appropriate, able to give a coherent history. Speech fluent without evidence of aphasia. Able to follow 2 step commands without difficulty.  Cranial Nerves:  II: pupils equal, round, reactive to light III,IV, VI: ptosis not present, extra-ocular motions intact bilaterally  V,VII: smile symmetric, facial light touch sensation equal VIII: hearing grossly normal to voice  X: uvula elevates symmetrically  XI: bilateral shoulder shrug symmetric and strong XII: midline tongue extension without fassiculations Motor:  Normal tone. 5/5 strength of BUE and BLE major muscle groups including strong and equal grip strength and dorsiflexion/plantar flexion Sensory: light touch normal in all extremities. Gait: normal gait and balance.    Psychiatric:        Mood and Affect: Mood and affect normal.     ED Results / Procedures / Treatments   Labs (all labs ordered are listed, but only abnormal results are displayed) Labs Reviewed  RESP PANEL BY RT-PCR (FLU A&B, COVID) ARPGX2 - Abnormal; Notable for the following components:      Result Value   SARS Coronavirus 2 by RT PCR POSITIVE (*)    All other components within normal limits    EKG None  Radiology No results found.  Procedures Procedures (including critical care time)  Medications Ordered in ED Medications  acetaminophen (TYLENOL) tablet 1,000 mg (1,000 mg Oral Given 10/07/20 1913)  ketorolac (TORADOL) 30 MG/ML injection 30 mg (30 mg Intramuscular Given 10/07/20 1914)    ED Course  I have reviewed the triage vital signs and the nursing notes.  Pertinent labs  & imaging results that were available during my care of the patient were reviewed by me and considered in my medical decision making (see chart for details).    MDM Rules/Calculators/A&P                          Patient presenting for evaluation for Covid.  Reports symptoms ongoing for several days.  Patient nontoxic, well-appearing, no distress.  Vital signs are reassuring.  Tested for Covid in the ED. Results positive. Advised on quarantine measures. Will give Rx for symptomatic management. Advised on f/u and return precautions. Pt voiced understanding of the plan and reasons to return. All questions answered, pt stable for d/c.  Kenneth Glass was evaluated in  Emergency Department on 10/07/2020 for the symptoms described in the history of present illness. He was evaluated in the context of the global COVID-19 pandemic, which necessitated consideration that the patient might be at risk for infection with the SARS-CoV-2 virus that causes COVID-19. Institutional protocols and algorithms that pertain to the evaluation of patients at risk for COVID-19 are in a state of rapid change based on information released by regulatory bodies including the CDC and federal and state organizations. These policies and algorithms were followed during the patient's care in the ED.    Final Clinical Impression(s) / ED Diagnoses Final diagnoses:  COVID    Rx / DC Orders ED Discharge Orders         Ordered    benzonatate (TESSALON) 100 MG capsule  Every 8 hours        10/07/20 2025           Karrie Meres, PA-C 10/07/20 2025    Rolan Bucco, MD 10/07/20 2312

## 2020-10-07 NOTE — ED Triage Notes (Signed)
Frontal headache since yesterday.

## 2020-10-07 NOTE — Discharge Instructions (Signed)
You may alternate taking Tylenol and Ibuprofen as needed for pain control. You may take 400-600 mg of ibuprofen every 6 hours and [phone] mg of Tylenol every 6 hours. Do not exceed 4000 mg of Tylenol daily as this can lead to liver damage. Also, make sure to take Ibuprofen with meals as it can cause an upset stomach. Do not take other NSAIDs while taking Ibuprofen such as (Aleve, Naprosyn, Aspirin, Celebrex, etc) and do not take more than the prescribed dose as this can lead to ulcers and bleeding in your GI tract. You may use warm and cold compresses to help with your symptoms.   Take tessalon for cough as directed.   You should be isolated for at least 7 days since the onset of your symptoms AND >72 hours after symptoms resolution (absence of fever without the use of fever reducing medication and improvement in respiratory symptoms), whichever is longer  Please follow up with your primary care provider within 5-7 days for re-evaluation of your symptoms. If you do not have a primary care provider, information for a healthcare clinic has been provided for you to make arrangements for follow up care. Please return to the emergency department for any new or worsening symptoms including shortness of breath or chest pain.

## 2020-10-27 ENCOUNTER — Telehealth: Payer: Self-pay | Admitting: Nurse Practitioner

## 2020-10-27 NOTE — Telephone Encounter (Signed)
[facility] called pt LVM for HFU advice & scheduling appt at clinic ([phone]).  

## 2021-01-29 DIAGNOSIS — M542 Cervicalgia: Secondary | ICD-10-CM | POA: Diagnosis not present

## 2021-01-29 DIAGNOSIS — M25511 Pain in right shoulder: Secondary | ICD-10-CM | POA: Diagnosis not present

## 2021-01-30 ENCOUNTER — Ambulatory Visit: Payer: BC Managed Care – PPO | Admitting: Family Medicine

## 2021-05-24 ENCOUNTER — Encounter (HOSPITAL_BASED_OUTPATIENT_CLINIC_OR_DEPARTMENT_OTHER): Payer: Self-pay | Admitting: Emergency Medicine

## 2021-05-24 ENCOUNTER — Other Ambulatory Visit: Payer: Self-pay

## 2021-05-24 ENCOUNTER — Other Ambulatory Visit (HOSPITAL_BASED_OUTPATIENT_CLINIC_OR_DEPARTMENT_OTHER): Payer: Self-pay

## 2021-05-24 ENCOUNTER — Emergency Department (HOSPITAL_BASED_OUTPATIENT_CLINIC_OR_DEPARTMENT_OTHER)
Admission: EM | Admit: 2021-05-24 | Discharge: 2021-05-24 | Disposition: A | Discharge status: Home / Self Care | Payer: BC Managed Care – PPO | Attending: Emergency Medicine | Admitting: Emergency Medicine

## 2021-05-24 DIAGNOSIS — Z28310 Unvaccinated for covid-19: Secondary | ICD-10-CM | POA: Insufficient documentation

## 2021-05-24 DIAGNOSIS — U071 COVID-19: Secondary | ICD-10-CM | POA: Insufficient documentation

## 2021-05-24 DIAGNOSIS — R519 Headache, unspecified: Secondary | ICD-10-CM

## 2021-05-24 DIAGNOSIS — J019 Acute sinusitis, unspecified: Secondary | ICD-10-CM | POA: Insufficient documentation

## 2021-05-24 DIAGNOSIS — Z8616 Personal history of COVID-19: Secondary | ICD-10-CM | POA: Insufficient documentation

## 2021-05-24 DIAGNOSIS — Z96652 Presence of left artificial knee joint: Secondary | ICD-10-CM | POA: Insufficient documentation

## 2021-05-24 LAB — SARS CORONAVIRUS 2 (TAT 6-24 HRS): SARS Coronavirus 2: POSITIVE — AB

## 2021-05-24 MED ORDER — IBUPROFEN 800 MG PO TABS
800.0000 mg | ORAL_TABLET | Freq: Three times a day (TID) | ORAL | 0 refills | Status: DC
  Filled 2021-05-24: qty 21, 7d supply, fill #0

## 2021-05-24 MED ORDER — AMOXICILLIN 500 MG PO CAPS
1000.0000 mg | ORAL_CAPSULE | Freq: Two times a day (BID) | ORAL | 0 refills | Status: DC
  Filled 2021-05-24: qty 40, 10d supply, fill #0

## 2021-05-24 NOTE — ED Provider Notes (Signed)
[facility] EMERGENCY DEPARTMENT Provider Note   CSN: [id number] Arrival date & time: 05/24/21  0805     History Chief Complaint  Patient presents with   Headache    Kenneth Glass is a 40 y.o. male.  HPI Patient reports a right-sided headache behind the eye and at the temple.  This is been waxing and waning for about 4 days.  He has also had a lot of sinus and nasal congestion.  He has had thick drainage.  No blood.  Intermittent coughing.  No chest pain or shortness of breath.  Patient denies fevers or myalgias.  Patient unvaccinated for COVID.  He reports he did have COVID in December and headache at that time was much worse.  He does not feel his symptoms are COVID.  Reports he has had sinus infections before and this feels very similar    Past Medical History:  Diagnosis Date   Arthritis    Gastric ulcer    Hx of excision of mass     Patient Active Problem List   Diagnosis Date Noted   Healthcare maintenance 01/23/2018   Food allergy 01/23/2018   Chronic left shoulder pain 01/23/2018   Insomnia 01/23/2018   S/P left TKA 11/07/2015   S/P knee replacement 11/07/2015    Past Surgical History:  Procedure Laterality Date   KNEE SURGERY     KNEE SURGERY     TOTAL KNEE ARTHROPLASTY Left 11/07/2015   Procedure: LEFT TOTAL KNEE ARTHROPLASTY;  Surgeon: Durene Romans, MD;  Location: [facility];  Service: Orthopedics;  Laterality: Left;       Family History  Problem Relation Age of Onset   Hypertension Mother    Stroke Father    Sickle cell anemia Father    Diabetes Maternal Grandmother    Hypertension Maternal Grandmother     Social History   Tobacco Use   Smoking status: Never   Smokeless tobacco: Never  Vaping Use   Vaping Use: Never used  Substance Use Topics   Alcohol use: Yes    Comment: socially   Drug use: No    Home Medications Prior to Admission medications   Medication Sig Start Date End Date Taking? Authorizing Provider  amoxicillin  (AMOXIL) 500 MG capsule Take 2 capsules (1,000 mg total) by mouth 2 (two) times daily. 05/24/21  Yes Arby Barrette, MD  ibuprofen (ADVIL) 800 MG tablet Take 1 tablet (800 mg total) by mouth 3 (three) times daily. 05/24/21  Yes Arby Barrette, MD  zolpidem (AMBIEN) 5 MG tablet Take 1 tablet (5 mg total) by mouth at bedtime as needed for sleep. 01/23/18 05/15/20  William Hamburger D, NP    Allergies    Shellfish allergy and Sulfa antibiotics  Review of Systems   Review of Systems 10 systems reviewed and negative except as per HPI Physical Exam Updated Vital Signs BP 132/83 (BP Location: Right Arm)   Pulse 75   Temp 98.4 F (36.9 C) (Oral)   Resp 20   Ht 6\' 2"  (1.88 m)   Wt 77.1 kg   SpO2 100%   BMI 21.83 kg/m   Physical Exam Constitutional:      Appearance: Normal appearance.  HENT:     Head: Normocephalic and atraumatic.     Right Ear: Tympanic membrane normal.     Left Ear: Tympanic membrane normal.     Nose: Congestion present.     Comments: Mild congestion and bogginess.    Mouth/Throat:  Mouth: Mucous membranes are moist.     Pharynx: Oropharynx is clear.  Eyes:     Extraocular Movements: Extraocular movements intact.     Pupils: Pupils are equal, round, and reactive to light.  Cardiovascular:     Rate and Rhythm: Normal rate and regular rhythm.  Pulmonary:     Effort: Pulmonary effort is normal.     Breath sounds: Normal breath sounds.  Abdominal:     General: There is no distension.     Palpations: Abdomen is soft.     Tenderness: There is no abdominal tenderness.  Musculoskeletal:        General: Normal range of motion.  Skin:    General: Skin is warm and dry.  Neurological:     General: No focal deficit present.     Mental Status: He is alert and oriented to person, place, and time.     Coordination: Coordination normal.    ED Results / Procedures / Treatments   Labs (all labs ordered are listed, but only abnormal results are displayed) Labs Reviewed   SARS CORONAVIRUS 2 (TAT 6-24 HRS)    EKG None  Radiology No results found.  Procedures Procedures   Medications Ordered in ED Medications - No data to display  ED Course  I have reviewed the triage vital signs and the nursing notes.  Pertinent labs & imaging results that were available during my care of the patient were reviewed by me and considered in my medical decision making (see chart for details).    MDM Rules/Calculators/A&P                           Patient presents as outlined.  At this time symptoms most suggestive of sinusitis with unilateral headache and thick nasal drainage.  Patient reports history of similar.  Patient is not vaccinated for COVID.  We will proceed with COVID testing.  With the unilateral symptoms this does seem more suggestive of sinusitis, will opt to treat with amoxicillin.  Patient counseled for pain control with ibuprofen and over-the-counter Tylenol.  Patient is clinically well in appearance.  Nontoxic.  Mental status clear.  No acute distress.  Stable for discharge and continued outpatient management. Final Clinical Impression(s) / ED Diagnoses Final diagnoses:  Acute nonintractable headache, unspecified headache type  Acute sinusitis, recurrence not specified, unspecified location    Rx / DC Orders ED Discharge Orders          Ordered    amoxicillin (AMOXIL) 500 MG capsule  2 times daily        05/24/21 0859    ibuprofen (ADVIL) 800 MG tablet  3 times daily        05/24/21 0859             Arby Barrette, MD 05/24/21 [phone]

## 2021-05-24 NOTE — ED Triage Notes (Addendum)
Rt sided h/a around his rt eye x 1 week , denies injury or  blurred vision , states has been coughing,  no vaccine  , states it is his sinus

## 2021-05-24 NOTE — Discharge Instructions (Addendum)
1.  Follow-up on your COVID test.  This should be done within the next 6 to 24 hours.  Continue to isolate and wear your mask until you have gotten your results. 2.  You describe symptoms of sinus infection.  You have been given a prescription for amoxicillin to take.  Take as directed.  For pain control, you may take ibuprofen 800 mg every 8 hours.  In addition to this, if you need additional pain control, you may take over the counter extra strength Tylenol (as 1000 mg every 6 hours as needed).  You may use over-the-counter decongestants.  Pay attention however to medications that also contain acetaminophen as a combination medication.DO NOT  combine cold and sinus preparations containing acetaminophen with full dosing of extra strength Tylenol.

## 2022-09-17 DIAGNOSIS — M7918 Myalgia, other site: Secondary | ICD-10-CM | POA: Diagnosis not present

## 2022-09-17 DIAGNOSIS — M9903 Segmental and somatic dysfunction of lumbar region: Secondary | ICD-10-CM | POA: Diagnosis not present

## 2022-09-17 DIAGNOSIS — M9904 Segmental and somatic dysfunction of sacral region: Secondary | ICD-10-CM | POA: Diagnosis not present

## 2022-09-17 DIAGNOSIS — M9905 Segmental and somatic dysfunction of pelvic region: Secondary | ICD-10-CM | POA: Diagnosis not present

## 2023-10-01 DIAGNOSIS — M9901 Segmental and somatic dysfunction of cervical region: Secondary | ICD-10-CM | POA: Diagnosis not present

## 2023-10-06 DIAGNOSIS — M9902 Segmental and somatic dysfunction of thoracic region: Secondary | ICD-10-CM | POA: Diagnosis not present

## 2023-10-06 DIAGNOSIS — M9901 Segmental and somatic dysfunction of cervical region: Secondary | ICD-10-CM | POA: Diagnosis not present

## 2023-10-08 DIAGNOSIS — M9902 Segmental and somatic dysfunction of thoracic region: Secondary | ICD-10-CM | POA: Diagnosis not present

## 2023-10-08 DIAGNOSIS — M9901 Segmental and somatic dysfunction of cervical region: Secondary | ICD-10-CM | POA: Diagnosis not present

## 2023-10-13 DIAGNOSIS — M9902 Segmental and somatic dysfunction of thoracic region: Secondary | ICD-10-CM | POA: Diagnosis not present

## 2023-10-13 DIAGNOSIS — M9901 Segmental and somatic dysfunction of cervical region: Secondary | ICD-10-CM | POA: Diagnosis not present

## 2023-10-21 DIAGNOSIS — M9901 Segmental and somatic dysfunction of cervical region: Secondary | ICD-10-CM | POA: Diagnosis not present

## 2023-10-21 DIAGNOSIS — M9902 Segmental and somatic dysfunction of thoracic region: Secondary | ICD-10-CM | POA: Diagnosis not present

## 2023-10-28 DIAGNOSIS — M9902 Segmental and somatic dysfunction of thoracic region: Secondary | ICD-10-CM | POA: Diagnosis not present

## 2023-10-28 DIAGNOSIS — M9901 Segmental and somatic dysfunction of cervical region: Secondary | ICD-10-CM | POA: Diagnosis not present

## 2023-11-05 DIAGNOSIS — M9902 Segmental and somatic dysfunction of thoracic region: Secondary | ICD-10-CM | POA: Diagnosis not present

## 2023-11-05 DIAGNOSIS — M9901 Segmental and somatic dysfunction of cervical region: Secondary | ICD-10-CM | POA: Diagnosis not present

## 2023-11-10 DIAGNOSIS — M9902 Segmental and somatic dysfunction of thoracic region: Secondary | ICD-10-CM | POA: Diagnosis not present

## 2023-11-10 DIAGNOSIS — M9901 Segmental and somatic dysfunction of cervical region: Secondary | ICD-10-CM | POA: Diagnosis not present

## 2023-11-13 DIAGNOSIS — M9902 Segmental and somatic dysfunction of thoracic region: Secondary | ICD-10-CM | POA: Diagnosis not present

## 2023-11-13 DIAGNOSIS — M9901 Segmental and somatic dysfunction of cervical region: Secondary | ICD-10-CM | POA: Diagnosis not present

## 2023-11-19 DIAGNOSIS — M9902 Segmental and somatic dysfunction of thoracic region: Secondary | ICD-10-CM | POA: Diagnosis not present

## 2023-11-19 DIAGNOSIS — M9901 Segmental and somatic dysfunction of cervical region: Secondary | ICD-10-CM | POA: Diagnosis not present

## 2023-11-24 DIAGNOSIS — M9901 Segmental and somatic dysfunction of cervical region: Secondary | ICD-10-CM | POA: Diagnosis not present

## 2023-11-24 DIAGNOSIS — M9902 Segmental and somatic dysfunction of thoracic region: Secondary | ICD-10-CM | POA: Diagnosis not present

## 2023-12-11 DIAGNOSIS — M47892 Other spondylosis, cervical region: Secondary | ICD-10-CM | POA: Diagnosis not present

## 2023-12-11 DIAGNOSIS — R293 Abnormal posture: Secondary | ICD-10-CM | POA: Diagnosis not present

## 2023-12-11 DIAGNOSIS — M50323 Other cervical disc degeneration at C6-C7 level: Secondary | ICD-10-CM | POA: Diagnosis not present

## 2023-12-11 DIAGNOSIS — M50322 Other cervical disc degeneration at C5-C6 level: Secondary | ICD-10-CM | POA: Diagnosis not present

## 2023-12-18 DIAGNOSIS — M9902 Segmental and somatic dysfunction of thoracic region: Secondary | ICD-10-CM | POA: Diagnosis not present

## 2023-12-18 DIAGNOSIS — M9901 Segmental and somatic dysfunction of cervical region: Secondary | ICD-10-CM | POA: Diagnosis not present

## 2024-04-17 ENCOUNTER — Encounter (HOSPITAL_BASED_OUTPATIENT_CLINIC_OR_DEPARTMENT_OTHER): Payer: Self-pay | Admitting: Emergency Medicine

## 2024-04-17 ENCOUNTER — Other Ambulatory Visit: Payer: Self-pay

## 2024-04-17 ENCOUNTER — Observation Stay (HOSPITAL_BASED_OUTPATIENT_CLINIC_OR_DEPARTMENT_OTHER)
Admission: EM | Admit: 2024-04-17 | Discharge: 2024-04-19 | Disposition: A | Discharge status: Home / Self Care | Attending: Internal Medicine | Admitting: Internal Medicine

## 2024-04-17 DIAGNOSIS — K922 Gastrointestinal hemorrhage, unspecified: Principal | ICD-10-CM | POA: Insufficient documentation

## 2024-04-17 DIAGNOSIS — K920 Hematemesis: Secondary | ICD-10-CM | POA: Diagnosis not present

## 2024-04-17 DIAGNOSIS — Z1152 Encounter for screening for COVID-19: Secondary | ICD-10-CM | POA: Insufficient documentation

## 2024-04-17 DIAGNOSIS — D62 Acute posthemorrhagic anemia: Secondary | ICD-10-CM | POA: Insufficient documentation

## 2024-04-17 DIAGNOSIS — I4581 Long QT syndrome: Secondary | ICD-10-CM | POA: Diagnosis not present

## 2024-04-17 DIAGNOSIS — Z96652 Presence of left artificial knee joint: Secondary | ICD-10-CM | POA: Insufficient documentation

## 2024-04-17 DIAGNOSIS — K254 Chronic or unspecified gastric ulcer with hemorrhage: Secondary | ICD-10-CM | POA: Insufficient documentation

## 2024-04-17 DIAGNOSIS — J111 Influenza due to unidentified influenza virus with other respiratory manifestations: Secondary | ICD-10-CM

## 2024-04-17 DIAGNOSIS — J101 Influenza due to other identified influenza virus with other respiratory manifestations: Secondary | ICD-10-CM

## 2024-04-17 DIAGNOSIS — D649 Anemia, unspecified: Secondary | ICD-10-CM

## 2024-04-17 DIAGNOSIS — R9431 Abnormal electrocardiogram [ECG] [EKG]: Secondary | ICD-10-CM | POA: Insufficient documentation

## 2024-04-17 DIAGNOSIS — K921 Melena: Secondary | ICD-10-CM | POA: Diagnosis not present

## 2024-04-17 DIAGNOSIS — Z79899 Other long term (current) drug therapy: Secondary | ICD-10-CM | POA: Insufficient documentation

## 2024-04-17 DIAGNOSIS — R531 Weakness: Secondary | ICD-10-CM | POA: Diagnosis not present

## 2024-04-17 DIAGNOSIS — Z7401 Bed confinement status: Secondary | ICD-10-CM | POA: Diagnosis not present

## 2024-04-17 DIAGNOSIS — R000 Tachycardia, unspecified: Secondary | ICD-10-CM | POA: Diagnosis not present

## 2024-04-17 LAB — CBC WITH DIFFERENTIAL/PLATELET
Abs Immature Granulocytes: 0.01 10*3/uL (ref 0.00–0.07)
Basophils Absolute: 0 10*3/uL (ref 0.0–0.1)
Basophils Relative: 0 %
Eosinophils Absolute: 0 10*3/uL (ref 0.0–0.5)
Eosinophils Relative: 0 %
HCT: 27.7 % — ABNORMAL LOW (ref 39.0–52.0)
Hemoglobin: 9.9 g/dL — ABNORMAL LOW (ref 13.0–17.0)
Immature Granulocytes: 0 %
Lymphocytes Relative: 13 %
Lymphs Abs: 0.7 10*3/uL (ref 0.7–4.0)
MCH: 29.5 pg (ref 26.0–34.0)
MCHC: 35.7 g/dL (ref 30.0–36.0)
MCV: 82.4 fL (ref 80.0–100.0)
Monocytes Absolute: 0.7 10*3/uL (ref 0.1–1.0)
Monocytes Relative: 12 %
Neutro Abs: 4.3 10*3/uL (ref 1.7–7.7)
Neutrophils Relative %: 75 %
Platelets: 190 10*3/uL (ref 150–400)
RBC: 3.36 MIL/uL — ABNORMAL LOW (ref 4.22–5.81)
RDW: 15.6 % — ABNORMAL HIGH (ref 11.5–15.5)
WBC: 5.7 10*3/uL (ref 4.0–10.5)
nRBC: 0 % (ref 0.0–0.2)

## 2024-04-17 LAB — LIPASE, BLOOD: Lipase: 21 U/L (ref 11–51)

## 2024-04-17 LAB — CBC
HCT: 25.8 % — ABNORMAL LOW (ref 39.0–52.0)
Hemoglobin: 8.7 g/dL — ABNORMAL LOW (ref 13.0–17.0)
MCH: 29.1 pg (ref 26.0–34.0)
MCHC: 33.7 g/dL (ref 30.0–36.0)
MCV: 86.3 fL (ref 80.0–100.0)
Platelets: 193 10*3/uL (ref 150–400)
RBC: 2.99 MIL/uL — ABNORMAL LOW (ref 4.22–5.81)
RDW: 15.9 % — ABNORMAL HIGH (ref 11.5–15.5)
WBC: 4.7 10*3/uL (ref 4.0–10.5)
nRBC: 0 % (ref 0.0–0.2)

## 2024-04-17 LAB — COMPREHENSIVE METABOLIC PANEL WITH GFR
ALT: 18 U/L (ref 0–44)
AST: 22 U/L (ref 15–41)
Albumin: 4 g/dL (ref 3.5–5.0)
Alkaline Phosphatase: 65 U/L (ref 38–126)
Anion gap: 10 (ref 5–15)
BUN: 31 mg/dL — ABNORMAL HIGH (ref 6–20)
CO2: 25 mmol/L (ref 22–32)
Calcium: 8.4 mg/dL — ABNORMAL LOW (ref 8.9–10.3)
Chloride: 101 mmol/L (ref 98–111)
Creatinine, Ser: 0.75 mg/dL (ref 0.61–1.24)
GFR, Estimated: >60 mL/min (ref >60)
Glucose, Bld: 96 mg/dL (ref 70–99)
Potassium: 4.1 mmol/L (ref 3.5–5.1)
Sodium: 136 mmol/L (ref 135–145)
Total Bilirubin: 0.7 mg/dL (ref 0.0–1.2)
Total Protein: 6.8 g/dL (ref 6.5–8.1)

## 2024-04-17 LAB — RESP PANEL BY RT-PCR (RSV, FLU A&B, COVID)  RVPGX2
Influenza A by PCR: POSITIVE — AB
Influenza B by PCR: NEGATIVE
Resp Syncytial Virus by PCR: NEGATIVE
SARS Coronavirus 2 by RT PCR: NEGATIVE

## 2024-04-17 LAB — IRON AND TIBC
Iron: 69 ug/dL (ref 45–182)
Saturation Ratios: 21 % (ref 17.9–39.5)
TIBC: 325 ug/dL (ref 250–450)
UIBC: 256 ug/dL

## 2024-04-17 LAB — GROUP A STREP BY PCR: Group A Strep by PCR: NOT DETECTED

## 2024-04-17 LAB — FERRITIN: Ferritin: 29 ng/mL (ref 24–336)

## 2024-04-17 LAB — TYPE AND SCREEN
ABO/RH(D): O POS
Antibody Screen: NEGATIVE

## 2024-04-17 LAB — VITAMIN B12: Vitamin B-12: 144 pg/mL — ABNORMAL LOW (ref 180–914)

## 2024-04-17 LAB — OCCULT BLOOD X 1 CARD TO LAB, STOOL: Fecal Occult Bld: NEGATIVE

## 2024-04-17 LAB — MAGNESIUM: Magnesium: 2.1 mg/dL (ref 1.7–2.4)

## 2024-04-17 MED ORDER — PROCHLORPERAZINE EDISYLATE 10 MG/2ML IJ SOLN
10.0000 mg | Freq: Once | INTRAMUSCULAR | Status: AC
  Administered 2024-04-17: 10 mg via INTRAVENOUS
  Filled 2024-04-17: qty 2

## 2024-04-17 MED ORDER — SODIUM CHLORIDE 0.9 % IV BOLUS
1000.0000 mL | Freq: Once | INTRAVENOUS | Status: AC
  Administered 2024-04-17: 1000 mL via INTRAVENOUS

## 2024-04-17 MED ORDER — ACETAMINOPHEN 650 MG RE SUPP: 650.0000 mg | Freq: Four times a day (QID) | RECTAL | Status: DC | PRN

## 2024-04-17 MED ORDER — ACETAMINOPHEN 325 MG PO TABS
650.0000 mg | ORAL_TABLET | Freq: Four times a day (QID) | ORAL | Status: DC | PRN
  Administered 2024-04-17 – 2024-04-19 (×4): 650 mg via ORAL
  Filled 2024-04-17 (×4): qty 2

## 2024-04-17 MED ORDER — DIPHENHYDRAMINE HCL 50 MG/ML IJ SOLN
12.5000 mg | Freq: Once | INTRAMUSCULAR | Status: AC
  Administered 2024-04-17: 12.5 mg via INTRAVENOUS
  Filled 2024-04-17: qty 1

## 2024-04-17 MED ORDER — PANTOPRAZOLE SODIUM 40 MG IV SOLR
40.0000 mg | Freq: Two times a day (BID) | INTRAVENOUS | Status: DC
  Administered 2024-04-17 – 2024-04-18 (×3): 40 mg via INTRAVENOUS
  Filled 2024-04-17 (×3): qty 10

## 2024-04-17 MED ORDER — PANTOPRAZOLE SODIUM 40 MG IV SOLR
40.0000 mg | Freq: Once | INTRAVENOUS | Status: AC
  Administered 2024-04-17: 40 mg via INTRAVENOUS
  Filled 2024-04-17: qty 10

## 2024-04-17 NOTE — ED Notes (Signed)
 Pt alert, NAD, calm, interactive, resps e/u, speaking in clear complete sentences. EDPA at [facility]. Family x2 at [facility].

## 2024-04-17 NOTE — Assessment & Plan Note (Signed)
 Optimize electrolytes Keep on telemetry Avoid qt prolonging drugs  Repeat ekg in AM

## 2024-04-17 NOTE — Assessment & Plan Note (Signed)
 No recent labs for baseline Hgb within normal limits 5 years ago.  Hgb of 9.9 today Check iron studies Trend in setting of clinical [facility]

## 2024-04-17 NOTE — ED Provider Notes (Signed)
 [facility] Provider Note   CSN: [id number] Arrival date & time: 04/17/24  1316    Patient presents with: Emesis  Kenneth Glass is a 43 y.o. male 43 year old here for evaluation of emesis. Few days ago had HA, sore throat and sub fever. No sudden onset headache, blurred vision, numbness, vision changes, neck pain, stiffness.  States he had a history of bleeding ulcers.  He has been having dark emesis (looks like black mudd) and melena.  Not on any vitamins, iron supplementation, no Pepto-Bismol.  He was taking NSAIDs for his headache, sore throat and nasal congestion over the last week or so.  He denies any chronic NSAID use, EtOH use, marijuana use.  No abdominal pain.  Does have some lightheadedness when he stands.  Not currently followed by GI.  No chest pain, shortness of breath.   HPI     Prior to Admission medications   Medication Sig Start Date End Date Taking? Authorizing Provider  amoxicillin  (AMOXIL ) 500 MG capsule Take 2 capsules (1,000 mg total) by mouth 2 (two) times daily. 05/24/21   Armenta Canning, MD  ibuprofen  (ADVIL ) 800 MG tablet Take 1 tablet (800 mg total) by mouth 3 (three) times daily. 05/24/21   Armenta Canning, MD  zolpidem  (AMBIEN ) 5 MG tablet Take 1 tablet (5 mg total) by mouth at bedtime as needed for sleep. 01/23/18 05/15/20  Jonel Pee D, NP    Allergies: Shellfish allergy and Sulfa antibiotics    Review of Systems  Constitutional: Negative.   HENT:  Positive for congestion (resolved) and sore throat (resolved).   Respiratory: Negative.    Cardiovascular: Negative.   Gastrointestinal:  Positive for blood in stool, nausea and vomiting. Negative for abdominal distention, abdominal pain, anal bleeding, constipation, diarrhea and rectal pain.  Genitourinary: Negative.   Musculoskeletal: Negative.   Skin: Negative.   Neurological:  Positive for light-headedness and headaches. Negative for dizziness, tremors,  seizures, syncope, facial asymmetry, speech difficulty, weakness and numbness.  All other systems reviewed and are negative.   Updated Vital Signs BP 118/76   Pulse 90   Temp 98.6 F (37 C)   Resp 12   Ht 6' 2 (1.88 m)   Wt 77.1 kg   SpO2 100%   BMI 21.83 kg/m   Physical Exam Vitals and nursing note reviewed.  Constitutional:      General: He is not in acute distress.    Appearance: He is well-developed. He is not ill-appearing, toxic-appearing or diaphoretic.  HENT:     Head: Normocephalic and atraumatic.     Nose: Nose normal.     Mouth/Throat:     Mouth: Mucous membranes are moist.     Comments: PO clear  Eyes:     Pupils: Pupils are equal, round, and reactive to light.   Neck:     Comments: Full ROM Cardiovascular:     Rate and Rhythm: Normal rate and regular rhythm.     Pulses: Normal pulses.     Heart sounds: Normal heart sounds.  Pulmonary:     Effort: Pulmonary effort is normal. No respiratory distress.     Breath sounds: Normal breath sounds.  Abdominal:     General: There is no distension.     Palpations: Abdomen is soft.     Tenderness: There is no abdominal tenderness. There is no right CVA tenderness, left CVA tenderness or guarding.  Genitourinary:    Comments: No stool in rectal vault--very  difficult exam due to patient compliance  Musculoskeletal:        General: No swelling, tenderness, deformity or signs of injury. Normal range of motion.     Cervical back: Normal range of motion and neck supple.     Right lower leg: No edema.     Left lower leg: No edema.   Skin:    General: Skin is warm and dry.     Capillary Refill: Capillary refill takes less than 2 seconds.   Neurological:     General: No focal deficit present.     Mental Status: He is alert and oriented to person, place, and time.    (all labs ordered are listed, but only abnormal results are displayed) Labs Reviewed  RESP PANEL BY RT-PCR (RSV, FLU A&B, COVID)  RVPGX2 -  Abnormal; Notable for the following components:      Result Value   Influenza A by PCR POSITIVE (*)    All other components within normal limits  COMPREHENSIVE METABOLIC PANEL WITH GFR - Abnormal; Notable for the following components:   BUN 31 (*)    Calcium 8.4 (*)    All other components within normal limits  CBC WITH DIFFERENTIAL/PLATELET - Abnormal; Notable for the following components:   RBC 3.36 (*)    Hemoglobin 9.9 (*)    HCT 27.7 (*)    RDW 15.6 (*)    All other components within normal limits  GROUP A STREP BY PCR  LIPASE, BLOOD  OCCULT BLOOD X 1 CARD TO LAB, STOOL  URINALYSIS, W/ REFLEX TO CULTURE (INFECTION SUSPECTED)  URINE DRUG SCREEN  CBC  CBC  CBC  FERRITIN  IRON AND TIBC  VITAMIN B12  FOLATE  MAGNESIUM     EKG: None  Radiology: No results found.   Procedures   Medications Ordered in the ED  pantoprazole  (PROTONIX ) injection 40 mg (has no administration in time range)  sodium chloride  0.9 % bolus 1,000 mL (0 mLs Intravenous Stopped 04/17/24 1513)  pantoprazole  (PROTONIX ) injection 40 mg (40 mg Intravenous Given 04/17/24 1438)  prochlorperazine  (COMPAZINE ) injection 10 mg (10 mg Intravenous Given 04/17/24 1438)  diphenhydrAMINE  (BENADRYL ) injection 12.5 mg (12.5 mg Intravenous Given 04/17/24 6421)    43 year old here for evaluation of emesis. Started with UR sx last week, taking NSAIDS. Last 2 days developed dark emesis and black stool. No vitamins/ irons supp, Pepto use. Hx of GI bleed due to similar. GI exam with no stool in rectal vault.  He is mildly tachycardic however normotensive.  He has no abdominal pain, abdomen soft, nontender.  He has a nonfocal neuroexam.  Will plan on labs, symptom management and reassess  Labs and imaging personally viewed interpreted Occult negative-no stool in rectal vault, poor compliance with patient CBC without leukocytosis, hemoglobin 9.9, prior 5 years ago however was at 13 Metabolic panel BUN 31 Lipase 21  Patient  reassessed.  Feels improved.  Story concerning for upper GI bleed given his NSAID use and history.  His abdomen is soft, nontender.  I have low suspicion for perforated viscus, infectious process, arterial bleed.  He had a headache earlier that resolved I low suspicion for acute CVA, head bleed, dissection, age, acute angle glaucoma, meningitis, giant cell arteritis.  Resolved with migraine cocktail.  Patient without any emesis here.  Discussed with GI.  Discussed outpatient versus inpatient management.  Recommends inpatient, will see in consult, plan for EGD  Consult with Dr. waddell with medicine who is agreeable for admission.  The patient appears reasonably stabilized for admission considering the current resources, flow, and capabilities available in the ED at this time, and I doubt any other [facility] requiring further screening and/or treatment in the ED prior to admission.   Clinical Course as of 04/17/24 1632  Sat Apr 17, 2024  1554 Dr. Kriss with GI rec admission, Will see in consult [BH]    Clinical Course User Index [BH] Hayle Parisi A, PA-C                                 Medical Decision Making Amount and/or Complexity of Data Reviewed Independent Historian: spouse External Data Reviewed: labs, radiology, ECG and notes. Labs: ordered. Decision-making details documented in ED Course. ECG/medicine tests: ordered and independent interpretation performed. Decision-making details documented in ED Course.  Risk OTC drugs. Prescription drug management. Parenteral controlled substances. Decision regarding hospitalization.      Final diagnoses:  Upper GI bleed  Influenza    ED Discharge Orders     None          Stelios Kirby A, PA-C 04/17/24 1632    Mannie Pac T, DO 04/24/24 1506

## 2024-04-17 NOTE — Assessment & Plan Note (Addendum)
 Droplet precautions  7 days of symptoms and not being admitted for respiratory symptoms.   No indication for tamiflu

## 2024-04-17 NOTE — Assessment & Plan Note (Addendum)
 43 year old who presented to ED with complaints of headache and 3 days of dark colored emesis with black/tarry stools in setting of NSAID use for flu like symptoms and history of UGIB on NSAIDs in 2006 -obs to tele -fecal occult negative, but unable to obtain appropriate sample per EGD -history concerning for UGIB with recent NSAID use, coffee ground emesis and dark stool -BUN 31 -GI consulted with plans for EGD tomorrow -protonix IV BID -type and screen and trend CBC  -clear liquids and NPO at midnight

## 2024-04-17 NOTE — Progress Notes (Signed)
 Plan of Care Note for accepted transfer   Patient: Kenneth Glass MRN: [id number]   DOA: 04/17/2024  Facility requesting transfer: [facility] Requesting Provider: Dr. Wendelyn Fass, PA  Reason for transfer: concern for UGIB. Emesis x 3 days  Facility course: 43 year old male with no significant past medical history who presented to ED with complaints of dark emesis and black, tarry stool. He has a remote history of GI bleeds due to NSAID use in past. He has been taking NSAIDs q 6 hours since feeling bad. He has been feeling bad x 1.5 weeks of headache, sore throat, myalgias and has had vomiting the past 3 days.    Vitals: afebrile, bp: 105/78, HR: 105, RR: 16, oxygen: 100%RA Orthostatics wnl  Pertinent labs: hgb: 9.9, hct: 27.7, flu A positive,  In ED: given protonix, 1L IVF bolus, compazine and GI consulted. Dr. Edythe with [facility] would like him observed overnight and may do EGD tomorrow.   Plan of care: The patient is accepted for admission to Progressive unit, at [facility] or [facility]. Have ordered clear liquid diet and NPO at midnight. PPI q 12 hours and serial CBCs. ..    Author: Isaiah Geralds, MD 04/17/2024  Check [email or web address] for on-call coverage.  Nursing staff, Please call [facility] Admits & Consults System-Wide number on Amion as soon as patient's arrival, so appropriate admitting provider can evaluate the pt.

## 2024-04-17 NOTE — ED Notes (Signed)
 Rectal exam by EDPA tolerated well, chaperone present. Pt endorses abd pain, nausea, HA and dizziness. Resting comfortably. Alert, NAD, calm, interactive. Family x2 at [facility].

## 2024-04-17 NOTE — ED Notes (Signed)
 Called [facility] for transport spoke with george

## 2024-04-17 NOTE — H&P (Signed)
 History and Physical    Patient: Kenneth Glass FMW:[id number] DOB: 05-21-1981 DOA: 04/17/2024 DOS: the patient was seen and examined on 04/17/2024 PCP: Patient, No Pcp Per  Patient coming from: [facility] - lives with his wife and daughter. Ambulates independently.    Chief Complaint: concern for UGIB, emesis x 3 days/dark tarry stools   HPI: Kenneth Glass is a 43 y.o. male with no significant past medical history who presented to ED with complaints of dark emesis and black, tarry stool. He has a remote history of GI bleeds due to NSAID use in past. He has been taking NSAIDs q 6 hours since feeling bad. He has been feeling bad x 1.5 weeks of headache, sore throat, myalgias and has had vomiting the past 3 days. He states he has had about 2 episodes of dark vomiting a day over the past few days.   He states he was in [place] and came back on Sunday. He has body aches,sore throat and fever. He had a cough, but this is better. He no longer has a sore throat. His headache has persisted and rates it a 8/10. No vision changes or focal deficits. Headache is all over. Dull in nature. Nothing seems to help this, but tylenol  has improved it. He states it is not as bad as it was. Denies any neck pain.  He started to take Ibuprofen  400mg  on Sunday every 6 hours. He has not taken daily.    Denies any fever/chills, vision changes chest pain or palpitations, shortness of breath or cough, abdominal pain,  dysuria or leg swelling.    He does not smoke or drink alcohol.    ER Course:  Vitals: afebrile, bp: 105/78, HR: 105, RR: 16, oxygen: 100%RA Orthostatics wnl  Pertinent labs: hgb: 9.9, hct: 27.7, flu A positive,  In ED: given protonix, 1L IVF bolus, compazine and GI consulted. Dr. Edythe with [facility] would like him observed overnight and may do EGD tomorrow.    Review of Systems: As mentioned in the history of present illness. All other systems reviewed and are negative. Past Medical History:  Diagnosis  Date   Arthritis    Gastric ulcer    Hx of excision of mass    Past Surgical History:  Procedure Laterality Date   KNEE SURGERY     KNEE SURGERY     TOTAL KNEE ARTHROPLASTY Left 11/07/2015   Procedure: LEFT TOTAL KNEE ARTHROPLASTY;  Surgeon: Donnice Car, MD;  Location: [facility];  Service: Orthopedics;  Laterality: Left;   Social History:  reports that he has never smoked. He has never used smokeless tobacco. He reports current alcohol use. He reports that he does not use drugs.  Allergies  Allergen Reactions   Shellfish Allergy Shortness Of Breath   Sulfa Antibiotics     Childhood; not sure of rxn    Family History  Problem Relation Age of Onset   Hypertension Mother    Stroke Father    Sickle cell anemia Father    Diabetes Maternal Grandmother    Hypertension Maternal Grandmother     Prior to Admission medications   Medication Sig Start Date End Date Taking? Authorizing Provider  amoxicillin  (AMOXIL ) 500 MG capsule Take 2 capsules (1,000 mg total) by mouth 2 (two) times daily. 05/24/21   Armenta Canning, MD  ibuprofen  (ADVIL ) 800 MG tablet Take 1 tablet (800 mg total) by mouth 3 (three) times daily. 05/24/21   Armenta Canning, MD  zolpidem  (AMBIEN ) 5 MG tablet  Take 1 tablet (5 mg total) by mouth at bedtime as needed for sleep. 01/23/18 05/15/20  Jonel Pee D, NP    Physical Exam: Vitals:   04/17/24 1515 04/17/24 1530 04/17/24 1822 04/17/24 1950  BP: 123/79 118/76 112/81 115/78  Pulse: (!) 106 90 97 94  Resp: (!) 22 12 20 16   Temp:   99.3 F (37.4 C) 99.4 F (37.4 C)  TempSrc:   Oral Oral  SpO2: 100% 100% 100% 100%  Weight:      Height:       General:  Appears calm and comfortable and is in NAD Eyes:  PERRL, EOMI, normal lids, iris ENT:  grossly normal hearing, lips & tongue, mmm; appropriate dentition Neck:  no LAD, masses or thyromegaly; no carotid bruits Cardiovascular:  RRR, no m/r/g. No LE edema.  Respiratory:   CTA bilaterally with no wheezes/rales/rhonchi.   Normal respiratory effort. Abdomen:  soft, NT, ND, NABS Back:   normal alignment, no CVAT Skin:  no rash or induration seen on limited exam Musculoskeletal:  grossly normal tone BUE/BLE, good ROM, no bony abnormality Lower extremity:  No LE edema.  Limited foot exam with no ulcerations.  2+ distal pulses. Psychiatric:  grossly normal mood and affect, speech fluent and appropriate, AOx3 Neurologic:  CN 2-12 grossly intact, moves all extremities in coordinated fashion, sensation intact   Radiological Exams on Admission: Independently reviewed - see discussion in A/P where applicable  No results found.  EKG: Independently reviewed.  Sinus tachycardia  with rate 114; nonspecific ST changes with no evidence of acute ischemia. Borderline prolonged QT    Labs on Admission: I have personally reviewed the available labs and imaging studies at the time of the admission.  Pertinent labs:   hgb: 9.9,  hct: 27.7,  flu A positive,   Assessment and Plan: Principal Problem:   Upper GI bleed Active Problems:   Influenza A   Normocytic anemia   borderline prolonged QT interval    Assessment and Plan: * Upper GI bleed 43 year old who presented to ED with complaints of headache and 3 days of dark colored emesis with black/tarry stools in setting of NSAID use for flu like symptoms and history of UGIB on NSAIDs in 2006 -obs to tele -fecal occult negative, but unable to obtain appropriate sample per EGD -history concerning for UGIB with recent NSAID use, coffee ground emesis and dark stool -BUN 31 -GI consulted with plans for EGD tomorrow -protonix IV BID -type and screen and trend CBC  -clear liquids and NPO at midnight   Influenza A Droplet precautions  7 days of symptoms and not being admitted for respiratory symptoms.   No indication for tamiflu   Normocytic anemia No recent labs for baseline Hgb within normal limits 5 years ago.  Hgb of 9.9 today Check iron studies Trend in  setting of clinical UGIB  borderline prolonged QT interval Optimize electrolytes Keep on telemetry Avoid qt prolonging drugs  Repeat ekg in AM       Advance Care Planning:   Code Status: Full Code   Consults: GI, Dr. Kriss  DVT Prophylaxis: SCDs  Family Communication: none   Severity of Illness: The appropriate patient status for this patient is OBSERVATION. Observation status is judged to be reasonable and necessary in order to provide the required intensity of service to ensure the patient's safety. The patient's presenting symptoms, physical exam findings, and initial radiographic and laboratory data in the context of their medical condition is  felt to place them at decreased risk for further clinical deterioration. Furthermore, it is anticipated that the patient will be medically stable for discharge from the hospital within 2 midnights of admission.   Author: Isaiah Geralds, MD 04/17/2024 8:45 PM  For on call review [email or web address].

## 2024-04-17 NOTE — ED Triage Notes (Signed)
 Pt c/o HA x few days; reports vomiting since Thurs; reports brownish material at times in vomit (has pictures); reports stools are black; denies abd pain

## 2024-04-18 ENCOUNTER — Observation Stay (HOSPITAL_COMMUNITY): Admitting: Certified Registered Nurse Anesthetist

## 2024-04-18 ENCOUNTER — Encounter (HOSPITAL_COMMUNITY)
Admission: EM | Disposition: A | Discharge status: Home / Self Care | Payer: Self-pay | Source: Home / Self Care | Attending: Emergency Medicine

## 2024-04-18 ENCOUNTER — Encounter (HOSPITAL_COMMUNITY): Payer: Self-pay | Admitting: Family Medicine

## 2024-04-18 DIAGNOSIS — D62 Acute posthemorrhagic anemia: Secondary | ICD-10-CM | POA: Diagnosis not present

## 2024-04-18 DIAGNOSIS — K921 Melena: Secondary | ICD-10-CM | POA: Diagnosis not present

## 2024-04-18 DIAGNOSIS — K3189 Other diseases of stomach and duodenum: Secondary | ICD-10-CM | POA: Diagnosis not present

## 2024-04-18 DIAGNOSIS — J111 Influenza due to unidentified influenza virus with other respiratory manifestations: Secondary | ICD-10-CM | POA: Diagnosis not present

## 2024-04-18 DIAGNOSIS — K254 Chronic or unspecified gastric ulcer with hemorrhage: Secondary | ICD-10-CM | POA: Diagnosis not present

## 2024-04-18 DIAGNOSIS — K922 Gastrointestinal hemorrhage, unspecified: Secondary | ICD-10-CM | POA: Diagnosis not present

## 2024-04-18 DIAGNOSIS — K920 Hematemesis: Secondary | ICD-10-CM | POA: Diagnosis not present

## 2024-04-18 DIAGNOSIS — Z1152 Encounter for screening for COVID-19: Secondary | ICD-10-CM | POA: Diagnosis not present

## 2024-04-18 DIAGNOSIS — K2951 Unspecified chronic gastritis with bleeding: Secondary | ICD-10-CM | POA: Diagnosis not present

## 2024-04-18 DIAGNOSIS — K2970 Gastritis, unspecified, without bleeding: Secondary | ICD-10-CM | POA: Diagnosis not present

## 2024-04-18 HISTORY — PX: ESOPHAGOGASTRODUODENOSCOPY: SHX5428

## 2024-04-18 LAB — CBC
HCT: 26.4 % — ABNORMAL LOW (ref 39.0–52.0)
HCT: 24.5 % — ABNORMAL LOW (ref 39.0–52.0)
Hemoglobin: 9 g/dL — ABNORMAL LOW (ref 13.0–17.0)
Hemoglobin: 8.3 g/dL — ABNORMAL LOW (ref 13.0–17.0)
MCH: 29.6 pg (ref 26.0–34.0)
MCH: 29.1 pg (ref 26.0–34.0)
MCHC: 34.1 g/dL (ref 30.0–36.0)
MCHC: 33.9 g/dL (ref 30.0–36.0)
MCV: 86.8 fL (ref 80.0–100.0)
MCV: 86 fL (ref 80.0–100.0)
Platelets: 178 10*3/uL (ref 150–400)
Platelets: 163 10*3/uL (ref 150–400)
RBC: 3.04 MIL/uL — ABNORMAL LOW (ref 4.22–5.81)
RBC: 2.85 MIL/uL — ABNORMAL LOW (ref 4.22–5.81)
RDW: 16.1 % — ABNORMAL HIGH (ref 11.5–15.5)
RDW: 16 % — ABNORMAL HIGH (ref 11.5–15.5)
WBC: 4.4 10*3/uL (ref 4.0–10.5)
WBC: 3.5 10*3/uL — ABNORMAL LOW (ref 4.0–10.5)
nRBC: 0 % (ref 0.0–0.2)
nRBC: 0 % (ref 0.0–0.2)

## 2024-04-18 LAB — BASIC METABOLIC PANEL WITH GFR
Anion gap: 5 (ref 5–15)
BUN: 20 mg/dL (ref 6–20)
CO2: 26 mmol/L (ref 22–32)
Calcium: 8.1 mg/dL — ABNORMAL LOW (ref 8.9–10.3)
Chloride: 106 mmol/L (ref 98–111)
Creatinine, Ser: 0.81 mg/dL (ref 0.61–1.24)
GFR, Estimated: >60 mL/min (ref >60)
Glucose, Bld: 87 mg/dL (ref 70–99)
Potassium: 4.3 mmol/L (ref 3.5–5.1)
Sodium: 137 mmol/L (ref 135–145)

## 2024-04-18 LAB — HIV ANTIBODY (ROUTINE TESTING W REFLEX): HIV Screen 4th Generation wRfx: NONREACTIVE

## 2024-04-18 LAB — FOLATE: Folate: 35.9 ng/mL (ref >5.9)

## 2024-04-18 SURGERY — EGD (ESOPHAGOGASTRODUODENOSCOPY)
Anesthesia: Monitor Anesthesia Care

## 2024-04-18 MED ORDER — SUCRALFATE 1 G PO TABS
1.0000 g | ORAL_TABLET | Freq: Three times a day (TID) | ORAL | Status: DC
  Administered 2024-04-18 – 2024-04-19 (×4): 1 g via ORAL
  Filled 2024-04-18 (×4): qty 1

## 2024-04-18 MED ORDER — ACETAMINOPHEN 10 MG/ML IV SOLN
1000.0000 mg | Freq: Once | INTRAVENOUS | Status: AC
  Administered 2024-04-18: 1000 mg via INTRAVENOUS
  Filled 2024-04-18: qty 100

## 2024-04-18 MED ORDER — PROPOFOL 500 MG/50ML IV EMUL
INTRAVENOUS | Status: DC | PRN
  Administered 2024-04-18: 180 ug/kg/min via INTRAVENOUS

## 2024-04-18 MED ORDER — LIDOCAINE 2% (20 MG/ML) 5 ML SYRINGE
INTRAMUSCULAR | Status: DC | PRN
  Administered 2024-04-18: 40 mg via INTRAVENOUS

## 2024-04-18 MED ORDER — PROPOFOL 10 MG/ML IV BOLUS
INTRAVENOUS | Status: DC | PRN
  Administered 2024-04-18: 20 mg via INTRAVENOUS

## 2024-04-18 MED ORDER — LORATADINE 10 MG PO TABS
10.0000 mg | ORAL_TABLET | Freq: Every day | ORAL | Status: DC | PRN
  Administered 2024-04-18: 10 mg via ORAL
  Filled 2024-04-18: qty 1

## 2024-04-18 MED ORDER — LACTATED RINGERS IV SOLN
INTRAVENOUS | Status: DC | PRN
Start: 2024-04-18 — End: 2024-04-18

## 2024-04-18 NOTE — Transfer of Care (Signed)
 Immediate Anesthesia Transfer of Care Note  Patient: Kenneth Glass  Procedure(s) Performed: EGD (ESOPHAGOGASTRODUODENOSCOPY)  Patient Location: Endoscopy Unit  Anesthesia Type:MAC  Level of Consciousness: sedated  Airway & Oxygen Therapy: Patient Spontanous Breathing and Patient connected to face mask oxygen  Post-op Assessment: Report given to RN and Post -op Vital signs reviewed and stable  Post vital signs: Reviewed and stable  Last Vitals:  Vitals Value Taken Time  BP 89/54 04/18/24 11:30  Temp 36.8 C 04/18/24 11:29  Pulse 94 04/18/24 11:36  Resp 14 04/18/24 11:36  SpO2 100 % 04/18/24 11:36  Vitals shown include unfiled device data.  Last Pain:  Vitals:   04/18/24 1129  TempSrc: Temporal  PainSc:          Complications: No notable events documented.

## 2024-04-18 NOTE — TOC Progression Note (Signed)
 Transition of Care [facility]) - Progression Note    Patient Details  Name: Kenneth Glass MRN: [id number] Date of Birth: [birth year]  Transition of Care [facility]) CM/SW Contact  Sonda Manuella Quill, RN Phone Number: 04/18/2024, 10:59 AM  Clinical Narrative:    Pt in procedural area; unable to complete TOC assessment.        Expected Discharge Plan and Services                                               Social Determinants of Health (SDOH) Interventions SDOH Screenings   Food Insecurity: No Food Insecurity (04/17/2024)  Housing: Low Risk  (04/17/2024)  Transportation Needs: No Transportation Needs (04/17/2024)  Utilities: Not At Risk (04/17/2024)  Tobacco Use: Low Risk  (04/18/2024)    Readmission Risk Interventions     No data to display

## 2024-04-18 NOTE — Interval H&P Note (Signed)
 History and Physical Interval Note:  04/18/2024 11:04 AM  Kenneth Glass  has presented today for surgery, with the diagnosis of Melena, anemia.  The various methods of treatment have been discussed with the patient and family. After consideration of risks, benefits and other options for treatment, the patient has consented to  Procedure(s): EGD (ESOPHAGOGASTRODUODENOSCOPY) (N/A) as a surgical intervention.  The patient's history has been reviewed, patient examined, no change in status, stable for surgery.  I have reviewed the patient's chart and labs.  Questions were answered to the patient's satisfaction.     [name]

## 2024-04-18 NOTE — Op Note (Signed)
 [facility] Patient Name: Kenneth Glass Procedure Date: 04/18/2024 MRN: [id number] Attending MD: Estefana Keas DO, DO, [id number] Date of Birth: [birth year] CSN: [id number] Age: 43 Admit Type: Inpatient Procedure:                Upper GI endoscopy Indications:              Acute post hemorrhagic anemia, Coffee-ground                            emesis, Melena Providers:                Estefana Keas DO, DO, Almarie Pizza, RN, Felice Sar, Technician Referring MD:              Medicines:                See the Anesthesia note for documentation of the                            administered medications Complications:            No immediate complications. Estimated Blood Loss:     Estimated blood loss: none. Procedure:                Pre-Anesthesia Assessment:                           - ASA Grade Assessment: II - A patient with mild                            systemic disease.                           - The risks and benefits of the procedure and the                            sedation options and risks were discussed with the                            patient. All questions were answered and informed                            consent was obtained.                           After obtaining informed consent, the endoscope was                            passed under direct vision. Throughout the                            procedure, the patient's blood pressure, pulse, and                            oxygen saturations were monitored continuously. The  [id number]-[id number] ([id number]) Olympus endoscope was introduced                            through the mouth, and advanced to the second part                            of duodenum. The upper GI endoscopy was                            accomplished without difficulty. The patient                            tolerated the procedure well. Scope In: Scope Out: Findings:      The  Z-line was regular and was found 43 cm from the incisors.      Scattered mild inflammation characterized by congestion (edema) and       erythema was found in the gastric body and in the gastric antrum.       Biopsies were taken with a cold forceps for Helicobacter pylori testing.      One non-bleeding cratered gastric ulcer with a visible vessel was found       in the prepyloric region of the stomach. The lesion was 11 mm in largest       dimension. Coagulation for bleeding prevention using bipolar probe was       successful. Estimated blood loss: none.      The examined duodenum was normal. Impression:               - Z-line regular, 43 cm from the incisors.                           - Gastritis. Biopsied.                           - Non-bleeding gastric ulcer with a visible vessel.                            Treated with bipolar cautery.                           - Normal examined duodenum. Moderate Sedation:      See the other procedure note for documentation of moderate sedation with       intraservice time. Recommendation:           - Return patient to hospital ward for ongoing care.                           - Resume regular diet.                           - Use Protonix (pantoprazole) 40 mg PO BID for 2                            months.                           - Use sucralfate tablets 1  gram PO BID for 7 days.                           - Check hemoglobin q 12 hours until stable.                           - Observe patient's clinical course.                           - Monitor for further melena.                           - Avoidance of all NSAIDs.                           - Follow up with [facility] (Dr. Dianna) in                            outpatient setting roughly 6 weeks after discharge                            from hospital to discuss repeat EGD to evaluate                            gastric ulcer. Procedure Code(s):        --- Professional ---                            [phone], Esophagogastroduodenoscopy, flexible,                            transoral; with biopsy, single or multiple Diagnosis Code(s):        --- Professional ---                           K29.70, Gastritis, unspecified, without bleeding                           K25.4, Chronic or unspecified gastric ulcer with                            hemorrhage                           D62, Acute posthemorrhagic anemia                           K92.0, Hematemesis                           K92.1, Melena (includes Hematochezia) CPT copyright 2022 [employer]. All rights reserved. The codes documented in this report are preliminary and upon coder review may  be revised to meet current compliance requirements. Dr Estefana Keas, DO Estefana Keas DO, DO 04/18/2024 11:33:08 AM Number of Addenda: 0

## 2024-04-18 NOTE — Progress Notes (Signed)
 Progress Note   Patient: Kenneth Glass FMW:[id number] DOB: [birth year] DOA: 04/17/2024     0 DOS: the patient was seen and examined on 04/18/2024   Brief hospital course: 43 y.o. male with no significant past medical history who presented to ED with complaints of dark emesis and black, tarry stool. He has a remote history of GI bleeds due to NSAID use in past. He has been taking NSAIDs q 6 hours since feeling bad. He has been feeling bad x 1.5 weeks of headache, sore throat, myalgias and has had vomiting the past 3 days. He states he has had about 2 episodes of dark vomiting a day over the past few days.    He states he was in [place] and came back on Sunday. He has body aches,sore throat and fever. He had a cough, but this is better. He no longer has a sore throat. His headache has persisted and rates it a 8/10. No vision changes or focal deficits. Headache is all over. Dull in nature. Nothing seems to help this, but tylenol  has improved it. He states it is not as bad as it was. Denies any neck pain.  He started to take Ibuprofen  400mg  on Sunday every 6 hours. He has not taken daily.     Denies any fever/chills, vision changes chest pain or palpitations, shortness of breath or cough, abdominal pain,  dysuria or leg swelling.   Assessment and Plan: Upper GI bleed with acute blood loss anemia 43 year old who presented to ED with complaints of headache and 3 days of dark colored emesis with black/tarry stools in setting of NSAID use for flu like symptoms and history of UGIB on NSAIDs in 2006 -GI consulted, now s/p EGD 6/29 with finding of gastritis and non-bleeding gastric ulcer with visible bleed treated with bipolar cautery -GI recs to continue BID PPI x 3 months and carafate bid x 7 days -pt to f/u with Dr. Dianna in 6 weeks for repeat EGD -Cont to follow q12h H/H per GI recs, recheck CBC in AM   Influenza A Droplet precautions  7 days of symptoms and not being admitted for respiratory  symptoms.   No indication for tamiflu    Normocytic anemia No recent labs for baseline Hgb within normal limits 5 years ago.  Iron studies reviewed, normal Trend in setting of clinical UGIB   borderline prolonged QT interval Optimize electrolytes Keep on telemetry Avoid qt prolonging drugs    Subjective: Pt seen after return from EGD. Without complaints. Denies abd pain  Physical Exam: Vitals:   04/18/24 1142 04/18/24 1150 04/18/24 1205 04/18/24 1639  BP: (!) 99/51 (!) 101/55 117/67 118/70  Pulse: 90 85 84 100  Resp: 14 16 20 20   Temp:   98 F (36.7 C) 98.6 F (37 C)  TempSrc:   Oral Oral  SpO2: 100% 100% 100% 100%  Weight:      Height:       General exam: Awake, laying in bed, in nad Respiratory system: Normal respiratory effort, no wheezing Cardiovascular system: regular rate, s1, s2 Gastrointestinal system: Soft, nondistended, positive BS Central nervous system: CN2-12 grossly intact, strength intact Extremities: Perfused, no clubbing Skin: Normal skin turgor, no notable skin lesions seen Psychiatry: Mood normal // no visual hallucinations   Data Reviewed:  Labs reviewed: Na 137, K 4.3, Cr 0.81, WBC 3.5, Hgb 8.3, Plts 163  Family Communication: Pt in room, family not at bedside  Disposition: Status is: Observation The patient remains  OBS appropriate and will d/c before 2 midnights.  Planned Discharge Destination: Home    Author: Garnette Pelt, MD 04/18/2024 5:04 PM  For on call review [email or web address].

## 2024-04-18 NOTE — Consult Note (Signed)
 [facility] Gastroenterology Consult  Referring Provider: No ref. provider found Primary Care Physician:  Patient, No Pcp Per Primary Gastroenterologist: [facility]. Dian[facility]  Reason for Consultation: Melena, coffee-ground emesis  SUBJECTIVE:   HPI: Kenneth Glass is a 43 y.o. male with past medical history significant for gastric ulcer in 2006.  Presented to hospital on 6-20 05/2024 with chief complaint of melena and coffee-ground emesis.  Symptoms of melena began on 04/15/2024.  Kenneth Glass noted using NSAIDs over the past few weeks for headache pain.  Kenneth Glass has been experiencing some coughing.  Low-grade fever at home.  No chest pain or shortness of breath.  No abdominal pain.  Does not recall prior colonoscopy.  No family history colon cancer.  Hemoglobin 9.9 with downtrend to 9.0 today.  Iron studies within normal limits.  EGD 07/21/2005 (Dr. Dianna) showed large gastric ulcer 1 cm in pylorus, small hiatal hernia.  EGD 11/15/2005 showed nonbleeding superficial gastric ulcer 3 mm, small hiatal hernia, diffuse inflammation of the duodenal bulb.  Past Medical History:  Diagnosis Date   Arthritis    Gastric ulcer    Hx of excision of mass    Past Surgical History:  Procedure Laterality Date   KNEE SURGERY     KNEE SURGERY     TOTAL KNEE ARTHROPLASTY Left 11/07/2015   Procedure: LEFT TOTAL KNEE ARTHROPLASTY;  Surgeon: Donnice Car, MD;  Location: [facility];  Service: Orthopedics;  Laterality: Left;   Prior to Admission medications   Medication Sig Start Date End Date Taking? Authorizing Provider  zolpidem  (AMBIEN ) 5 MG tablet Take 1 tablet (5 mg total) by mouth at bedtime as needed for sleep. 01/23/18 05/15/20  Jonel Pee D, NP   Current Facility-Administered Medications  Medication Dose Route Frequency Provider Last Rate Last Admin   [MAR Hold] acetaminophen  (TYLENOL ) tablet 650 mg  650 mg Oral Q6H PRN Waddell Rake, MD   650 mg at 04/17/24 2213   Or   [MAR Hold] acetaminophen  (TYLENOL ) suppository  650 mg  650 mg Rectal Q6H PRN Waddell Rake, MD       ILDA Hold] pantoprazole (PROTONIX) injection 40 mg  40 mg Intravenous Q12H Waddell Rake, MD   40 mg at 04/18/24 9147   Allergies as of 04/17/2024 - Review Complete 04/17/2024  Allergen Reaction Noted   Shellfish allergy Shortness Of Breath 10/19/2015   Sulfa antibiotics  04/18/2012   Family History  Problem Relation Age of Onset   Hypertension Mother    Stroke Father    Sickle cell anemia Father    Diabetes Maternal Grandmother    Hypertension Maternal Grandmother    Social History   Socioeconomic History   Marital status: Married    Spouse name: Not on file   Number of children: Not on file   Years of education: Not on file   Highest education level: Not on file  Occupational History   Not on file  Tobacco Use   Smoking status: Never   Smokeless tobacco: Never  Vaping Use   Vaping status: Never Used  Substance and Sexual Activity   Alcohol use: Yes    Comment: socially   Drug use: No   Sexual activity: Not on file  Other Topics Concern   Not on file  Social History Narrative   Not on file   Social Drivers of Health   Financial Resource Strain: Not on file  Food Insecurity: No Food Insecurity (04/17/2024)   Hunger Vital Sign    Worried About Running  Out of Food in the Last Year: Never true    Ran Out of Food in the Last Year: Never true  Transportation Needs: No Transportation Needs (04/17/2024)   PRAPARE - Administrator, Civil Service (Medical): No    Lack of Transportation (Non-Medical): No  Physical Activity: Not on file  Stress: Not on file  Social Connections: Not on file  Intimate Partner Violence: Not At Risk (04/17/2024)   Humiliation, Afraid, Rape, and Kick questionnaire    Fear of Current or Ex-Partner: No    Emotionally Abused: No    Physically Abused: No    Sexually Abused: No   Review of Systems:  Review of Systems  Constitutional:  Positive for fever.  Respiratory:   Positive for cough. Negative for shortness of breath.   Cardiovascular:  Negative for chest pain.  Gastrointestinal:  Positive for melena and vomiting. Negative for abdominal pain and nausea.       Coffee-ground emesis    OBJECTIVE:   Temp:  [98.6 F (37 C)-99.4 F (37.4 C)] 98.7 F (37.1 C) (06/29 1057) Pulse Rate:  [90-114] 105 (06/29 1057) Resp:  [10-22] 10 (06/29 1057) BP: (102-128)/(70-94) 112/75 (06/29 1057) SpO2:  [97 %-100 %] 97 % (06/29 1057) Weight:  [77.1 kg] 77.1 kg (06/28 1333) Last BM Date : 04/16/24 Physical Exam Constitutional:      General: Kenneth Glass is not in acute distress.    Appearance: Kenneth Glass is not ill-appearing, toxic-appearing or diaphoretic.   Cardiovascular:     Rate and Rhythm: Tachycardia present.  Pulmonary:     Breath sounds: Normal breath sounds.  Abdominal:     General: There is no distension.     Palpations: Abdomen is soft.     Tenderness: There is no abdominal tenderness. There is no guarding.   Musculoskeletal:     Right lower leg: No edema.     Left lower leg: No edema.   Skin:    General: Skin is warm and dry.   Neurological:     Mental Status: Kenneth Glass is alert.     Labs: Recent Labs    04/17/24 1421 04/17/24 2350 04/18/24 0552  WBC 5.7 4.7 4.4  HGB 9.9* 8.7* 9.0*  HCT 27.7* 25.8* 26.4*  PLT 190 193 178   BMET Recent Labs    04/17/24 1421 04/18/24 0552  NA 136 137  K 4.1 4.3  CL 101 106  CO2 25 26  GLUCOSE 96 87  BUN 31* 20  CREATININE 0.75 0.81  CALCIUM 8.4* 8.1*   LFT Recent Labs    04/17/24 1421  PROT 6.8  ALBUMIN 4.0  AST 22  ALT 18  ALKPHOS 65  BILITOT 0.7   PT/INR No results for input(s): LABPROT, INR in the last 72 hours.  Diagnostic imaging: No results found.  IMPRESSION: Melena Anemia, acute blood loss NSAID use History gastric ulcer 2006  PLAN: -Likely NSAID induced ulcer given history, recommend EGD to investigate further, discussed procedure with patient including benefits, alternatives  and risks of bleeding/infection/perforation/missed lesion/anesthesia, Kenneth Glass verbalized understanding and elected to proceed -N.p.o. -IV PPI every 12 hours -Trend H/H, transfuse for hemoglobin less than 7 -Avoid all NSAIDs -Further recommendations to follow pending procedure   LOS: 0 days   Estefana Keas, DO [facility]

## 2024-04-18 NOTE — Hospital Course (Signed)
 43 y.o. male with no significant past medical history who presented to ED with complaints of dark emesis and black, tarry stool. He has a remote history of GI bleeds due to NSAID use in past. He has been taking NSAIDs q 6 hours since feeling bad. He has been feeling bad x 1.5 weeks of headache, sore throat, myalgias and has had vomiting the past 3 days. He states he has had about 2 episodes of dark vomiting a day over the past few days.    He states he was in [place] and came back on Sunday. He has body aches,sore throat and fever. He had a cough, but this is better. He no longer has a sore throat. His headache has persisted and rates it a 8/10. No vision changes or focal deficits. Headache is all over. Dull in nature. Nothing seems to help this, but tylenol  has improved it. He states it is not as bad as it was. Denies any neck pain.  He started to take Ibuprofen  400mg  on Sunday every 6 hours. He has not taken daily.     Denies any fever/chills, vision changes chest pain or palpitations, shortness of breath or cough, abdominal pain,  dysuria or leg swelling.

## 2024-04-18 NOTE — Anesthesia Postprocedure Evaluation (Signed)
 Anesthesia Post Note  Patient: AVIEN TAHA  Procedure(s) Performed: EGD (ESOPHAGOGASTRODUODENOSCOPY)     Patient location during evaluation: PACU Anesthesia Type: MAC Level of consciousness: awake Pain management: pain level controlled Vital Signs Assessment: post-procedure vital signs reviewed and stable Respiratory status: spontaneous breathing, nonlabored ventilation and respiratory function stable Cardiovascular status: stable and blood pressure returned to baseline Postop Assessment: no apparent nausea or vomiting Anesthetic complications: no   No notable events documented.  Last Vitals:  Vitals:   04/18/24 1142 04/18/24 1150  BP: (!) 99/51 (!) 101/55  Pulse: 90 85  Resp: 14 16  Temp:    SpO2: 100% 100%    Last Pain:  Vitals:   04/18/24 1150  TempSrc:   PainSc: 0-No pain                 Delon Aisha Arch

## 2024-04-18 NOTE — H&P (View-Only) (Signed)
 [facility]astroenterology Consult  Referring Provider: No ref. provider found Primary Care Physician:  Patient, No Pcp Per Primary Gastroenterologist: [facility] Dian[facility]  Reason for Consultation: Melena, coffee-ground emesis  SUBJECTIVE:   HPI: Kenneth Glass is a 43 y.o. male with past medical history significant for gastric ulcer in 2006.  Presented to hospital on 6-20 05/2024 with chief complaint of melena and coffee-ground emesis.  Symptoms of melena began on 04/15/2024.  He noted using NSAIDs over the past few weeks for headache pain.  He has been experiencing some coughing.  Low-grade fever at home.  No chest pain or shortness of breath.  No abdominal pain.  Does not recall prior colonoscopy.  No family history colon cancer.  Hemoglobin 9.9 with downtrend to 9.0 today.  Iron studies within normal limits.  EGD 07/21/2005 (Dr. Dianna) showed large gastric ulcer 1 cm in pylorus, small hiatal hernia.  EGD 11/15/2005 showed nonbleeding superficial gastric ulcer 3 mm, small hiatal hernia, diffuse inflammation of the duodenal bulb.  Past Medical History:  Diagnosis Date   Arthritis    Gastric ulcer    Hx of excision of mass    Past Surgical History:  Procedure Laterality Date   KNEE SURGERY     KNEE SURGERY     TOTAL KNEE ARTHROPLASTY Left 11/07/2015   Procedure: LEFT TOTAL KNEE ARTHROPLASTY;  Surgeon: Donnice Car, MD;  Location: [facility];  Service: Orthopedics;  Laterality: Left;   Prior to Admission medications   Medication Sig Start Date End Date Taking? Authorizing Provider  zolpidem  (AMBIEN ) 5 MG tablet Take 1 tablet (5 mg total) by mouth at bedtime as needed for sleep. 01/23/18 05/15/20  Jonel Pee D, NP   Current Facility-Administered Medications  Medication Dose Route Frequency Provider Last Rate Last Admin   [MAR Hold] acetaminophen  (TYLENOL ) tablet 650 mg  650 mg Oral Q6H PRN Waddell Rake, MD   650 mg at 04/17/24 2213   Or   [MAR Hold] acetaminophen  (TYLENOL ) suppository  650 mg  650 mg Rectal Q6H PRN Waddell Rake, MD       ILDA Hold] pantoprazole (PROTONIX) injection 40 mg  40 mg Intravenous Q12H Waddell Rake, MD   40 mg at 04/18/24 9147   Allergies as of 04/17/2024 - Review Complete 04/17/2024  Allergen Reaction Noted   Shellfish allergy Shortness Of Breath 10/19/2015   Sulfa antibiotics  04/18/2012   Family History  Problem Relation Age of Onset   Hypertension Mother    Stroke Father    Sickle cell anemia Father    Diabetes Maternal Grandmother    Hypertension Maternal Grandmother    Social History   Socioeconomic History   Marital status: Married    Spouse name: Not on file   Number of children: Not on file   Years of education: Not on file   Highest education level: Not on file  Occupational History   Not on file  Tobacco Use   Smoking status: Never   Smokeless tobacco: Never  Vaping Use   Vaping status: Never Used  Substance and Sexual Activity   Alcohol use: Yes    Comment: socially   Drug use: No   Sexual activity: Not on file  Other Topics Concern   Not on file  Social History Narrative   Not on file   Social Drivers of Health   Financial Resource Strain: Not on file  Food Insecurity: No Food Insecurity (04/17/2024)   Hunger Vital Sign    Worried About Running  Out of Food in the Last Year: Never true    Ran Out of Food in the Last Year: Never true  Transportation Needs: No Transportation Needs (04/17/2024)   PRAPARE - Administrator, Civil Service (Medical): No    Lack of Transportation (Non-Medical): No  Physical Activity: Not on file  Stress: Not on file  Social Connections: Not on file  Intimate Partner Violence: Not At Risk (04/17/2024)   Humiliation, Afraid, Rape, and Kick questionnaire    Fear of Current or Ex-Partner: No    Emotionally Abused: No    Physically Abused: No    Sexually Abused: No   Review of Systems:  Review of Systems  Constitutional:  Positive for fever.  Respiratory:   Positive for cough. Negative for shortness of breath.   Cardiovascular:  Negative for chest pain.  Gastrointestinal:  Positive for melena and vomiting. Negative for abdominal pain and nausea.       Coffee-ground emesis    OBJECTIVE:   Temp:  [98.6 F (37 C)-99.4 F (37.4 C)] 98.7 F (37.1 C) (06/29 1057) Pulse Rate:  [90-114] 105 (06/29 1057) Resp:  [10-22] 10 (06/29 1057) BP: (102-128)/(70-94) 112/75 (06/29 1057) SpO2:  [97 %-100 %] 97 % (06/29 1057) Weight:  [77.1 kg] 77.1 kg (06/28 1333) Last BM Date : 04/16/24 Physical Exam Constitutional:      General: He is not in acute distress.    Appearance: He is not ill-appearing, toxic-appearing or diaphoretic.   Cardiovascular:     Rate and Rhythm: Tachycardia present.  Pulmonary:     Breath sounds: Normal breath sounds.  Abdominal:     General: There is no distension.     Palpations: Abdomen is soft.     Tenderness: There is no abdominal tenderness. There is no guarding.   Musculoskeletal:     Right lower leg: No edema.     Left lower leg: No edema.   Skin:    General: Skin is warm and dry.   Neurological:     Mental Status: He is alert.     Labs: Recent Labs    04/17/24 1421 04/17/24 2350 04/18/24 0552  WBC 5.7 4.7 4.4  HGB 9.9* 8.7* 9.0*  HCT 27.7* 25.8* 26.4*  PLT 190 193 178   BMET Recent Labs    04/17/24 1421 04/18/24 0552  NA 136 137  K 4.1 4.3  CL 101 106  CO2 25 26  GLUCOSE 96 87  BUN 31* 20  CREATININE 0.75 0.81  CALCIUM 8.4* 8.1*   LFT Recent Labs    04/17/24 1421  PROT 6.8  ALBUMIN 4.0  AST 22  ALT 18  ALKPHOS 65  BILITOT 0.7   PT/INR No results for input(s): LABPROT, INR in the last 72 hours.  Diagnostic imaging: No results found.  IMPRESSION: Melena Anemia, acute blood loss NSAID use History gastric ulcer 2006  PLAN: -Likely NSAID induced ulcer given history, recommend EGD to investigate further, discussed procedure with patient including benefits, alternatives  and risks of bleeding/infection/perforation/missed lesion/anesthesia, he verbalized understanding and elected to proceed -N.p.o. -IV PPI every 12 hours -Trend H/H, transfuse for hemoglobin less than 7 -Avoid all NSAIDs -Further recommendations to follow pending procedure   LOS: 0 days   Estefana Keas, DO [facility]

## 2024-04-18 NOTE — Anesthesia Preprocedure Evaluation (Addendum)
 Anesthesia Evaluation  Patient identified by MRN, date of birth, ID band Patient awake    Reviewed: Allergy & Precautions, NPO status , Patient's Chart, lab work & pertinent test results  History of Anesthesia Complications Negative for: history of anesthetic complications  Airway Mallampati: II  TM Distance: >3 FB Neck ROM: Full    Dental  (+) Dental Advisory Given, Missing   Pulmonary neg shortness of breath, neg sleep apnea, neg COPD, Recent URI  (influenza A)   Pulmonary exam normal breath sounds clear to auscultation       Cardiovascular (-) hypertension(-) angina (-) Past MI, (-) Cardiac Stents and (-) CABG + dysrhythmias (borderline prolonged QT)  Rhythm:Regular Rate:Normal     Neuro/Psych negative neurological ROS     GI/Hepatic Neg liver ROS, PUD,,,  Endo/Other  negative endocrine ROS    Renal/GU negative Renal ROS     Musculoskeletal  (+) Arthritis ,    Abdominal   Peds  Hematology  (+) Blood dyscrasia, anemia Lab Results      Component                Value               Date                      WBC                      4.4                 04/18/2024                HGB                      9.0 (L)             04/18/2024                HCT                      26.4 (L)            04/18/2024                MCV                      86.8                04/18/2024                PLT                      178                 04/18/2024              Anesthesia Other Findings   Reproductive/Obstetrics                              Anesthesia Physical Anesthesia Plan  ASA: 2  Anesthesia Plan: MAC   Post-op Pain Management: Minimal or no pain anticipated   Induction: Intravenous  PONV Risk Score and Plan: 1 and Propofol  infusion, TIVA and Treatment may vary due to age or medical condition  Airway Management Planned: Natural Airway and Nasal Cannula  Additional Equipment:    Intra-op Plan:   Post-operative Plan:  Informed Consent: I have reviewed the patients History and Physical, chart, labs and discussed the procedure including the risks, benefits and alternatives for the proposed anesthesia with the patient or authorized representative who has indicated his/her understanding and acceptance.     Dental advisory given  Plan Discussed with: CRNA and Anesthesiologist  Anesthesia Plan Comments: (Discussed with patient risks of MAC including, but not limited to, minor pain or discomfort, hearing people in the room, and possible need for backup general anesthesia. Risks for general anesthesia also discussed including, but not limited to, sore throat, hoarse voice, chipped/damaged teeth, injury to vocal cords, nausea and vomiting, allergic reactions, lung infection, heart attack, stroke, and death. All questions answered. )         Anesthesia Quick Evaluation

## 2024-04-19 ENCOUNTER — Other Ambulatory Visit (HOSPITAL_COMMUNITY): Payer: Self-pay

## 2024-04-19 DIAGNOSIS — J111 Influenza due to unidentified influenza virus with other respiratory manifestations: Secondary | ICD-10-CM | POA: Diagnosis not present

## 2024-04-19 DIAGNOSIS — K922 Gastrointestinal hemorrhage, unspecified: Secondary | ICD-10-CM | POA: Diagnosis not present

## 2024-04-19 LAB — CBC
HCT: 24.1 % — ABNORMAL LOW (ref 39.0–52.0)
Hemoglobin: 8.1 g/dL — ABNORMAL LOW (ref 13.0–17.0)
MCH: 29.3 pg (ref 26.0–34.0)
MCHC: 33.6 g/dL (ref 30.0–36.0)
MCV: 87.3 fL (ref 80.0–100.0)
Platelets: 176 10*3/uL (ref 150–400)
RBC: 2.76 MIL/uL — ABNORMAL LOW (ref 4.22–5.81)
RDW: 15.9 % — ABNORMAL HIGH (ref 11.5–15.5)
WBC: 3.2 10*3/uL — ABNORMAL LOW (ref 4.0–10.5)
nRBC: 0 % (ref 0.0–0.2)

## 2024-04-19 MED ORDER — SUCRALFATE 1 G PO TABS
1.0000 g | ORAL_TABLET | Freq: Three times a day (TID) | ORAL | 0 refills | Status: DC
  Filled 2024-04-19: qty 28, 7d supply, fill #0

## 2024-04-19 MED ORDER — FLUTICASONE PROPIONATE 50 MCG/ACT NA SUSP: 1.0000 | Freq: Every day | NASAL | Status: DC

## 2024-04-19 MED ORDER — SUMATRIPTAN SUCCINATE 25 MG PO TABS
25.0000 mg | ORAL_TABLET | Freq: Once | ORAL | Status: AC
  Administered 2024-04-19: 25 mg via ORAL
  Filled 2024-04-19: qty 1

## 2024-04-19 MED ORDER — FLUTICASONE PROPIONATE 50 MCG/ACT NA SUSP
1.0000 | Freq: Every day | NASAL | Status: DC
  Administered 2024-04-19: 1 via NASAL
  Filled 2024-04-19: qty 16

## 2024-04-19 MED ORDER — PANTOPRAZOLE SODIUM 40 MG PO TBEC
40.0000 mg | DELAYED_RELEASE_TABLET | Freq: Two times a day (BID) | ORAL | 0 refills | Status: DC
  Filled 2024-04-19: qty 60, 30d supply, fill #0

## 2024-04-19 MED ORDER — PANTOPRAZOLE SODIUM 40 MG PO TBEC
40.0000 mg | DELAYED_RELEASE_TABLET | Freq: Two times a day (BID) | ORAL | Status: DC
  Administered 2024-04-19: 40 mg via ORAL
  Filled 2024-04-19: qty 1

## 2024-04-19 NOTE — Progress Notes (Signed)
 Medications delivered to patient from [facility] pharmacy.

## 2024-04-19 NOTE — Progress Notes (Signed)
 [facility] Progress Note  SUBJECTIVE:   Interval history: Kenneth Glass was seen and evaluated today at bedside.  Resting comfortably in bed.  Denied abdominal pain.  No chest pain or shortness of breath.  No nausea or vomiting.  Past Medical History:  Diagnosis Date   Arthritis    Gastric ulcer    Hx of excision of mass    Past Surgical History:  Procedure Laterality Date   KNEE SURGERY     KNEE SURGERY     TOTAL KNEE ARTHROPLASTY Left 11/07/2015   Procedure: LEFT TOTAL KNEE ARTHROPLASTY;  Surgeon: Donnice Car, MD;  Location: [facility];  Service: Orthopedics;  Laterality: Left;   Current Facility-Administered Medications  Medication Dose Route Frequency Provider Last Rate Last Admin   acetaminophen  (TYLENOL ) tablet 650 mg  650 mg Oral Q6H PRN Kriss Estefana DEL, DO   650 mg at 04/19/24 0732   Or   acetaminophen  (TYLENOL ) suppository 650 mg  650 mg Rectal Q6H PRN Kriss Estefana DEL, DO       fluticasone  (FLONASE ) 50 MCG/ACT nasal spray 1 spray  1 spray Each Nare Daily Cindy Garnette POUR, MD   1 spray at 04/19/24 1125   loratadine (CLARITIN) tablet 10 mg  10 mg Oral Daily PRN Cindy Garnette POUR, MD   10 mg at 04/18/24 2034   pantoprazole (PROTONIX) EC tablet 40 mg  40 mg Oral BID Casimir Camelia RAMAN, RPH   40 mg at 04/19/24 1000   sucralfate (CARAFATE) tablet 1 g  1 g Oral TID WC & HS Kriss Estefana H, DO   1 g at 04/19/24 1125   Allergies as of 04/17/2024 - Review Complete 04/17/2024  Allergen Reaction Noted   Shellfish allergy Shortness Of Breath 10/19/2015   Sulfa antibiotics  04/18/2012   Review of Systems:  Review of Systems  Respiratory:  Negative for shortness of breath.   Cardiovascular:  Negative for chest pain.  Gastrointestinal:  Negative for abdominal pain, nausea and vomiting.    OBJECTIVE:   Temp:  [98.5 F (36.9 C)-99.9 F (37.7 C)] 99.1 F (37.3 C) (06/30 0805) Pulse Rate:  [91-100] 91 (06/30 0805) Resp:  [18-20] 18 (06/30 0507) BP:  (107-122)/(66-76) 122/76 (06/30 0805) SpO2:  [98 %-100 %] 99 % (06/30 0805) Last BM Date : 04/16/24 Physical Exam Constitutional:      General: He is not in acute distress.    Appearance: He is not ill-appearing, toxic-appearing or diaphoretic.   Cardiovascular:     Rate and Rhythm: Normal rate.  Pulmonary:     Effort: No respiratory distress.  Abdominal:     General: There is no distension.     Palpations: Abdomen is soft.     Tenderness: There is no abdominal tenderness. There is no guarding.   Neurological:     Mental Status: He is alert.     Labs: Recent Labs    04/18/24 0552 04/18/24 1327 04/19/24 0517  WBC 4.4 3.5* 3.2*  HGB 9.0* 8.3* 8.1*  HCT 26.4* 24.5* 24.1*  PLT 178 163 176   BMET Recent Labs    04/17/24 1421 04/18/24 0552  NA 136 137  K 4.1 4.3  CL 101 106  CO2 25 26  GLUCOSE 96 87  BUN 31* 20  CREATININE 0.75 0.81  CALCIUM 8.4* 8.1*   LFT Recent Labs    04/17/24 1421  PROT 6.8  ALBUMIN 4.0  AST 22  ALT 18  ALKPHOS 65  BILITOT 0.7  PT/INR No results for input(s): LABPROT, INR in the last 72 hours. Diagnostic imaging: No results found.  IMPRESSION: Melena, improved   - EGD 04/18/2024 with findings of pyloric channel ulcer with visible vessel status post gold probe cautery Anemia, acute blood loss NSAID use History gastric ulcer 2006  PLAN: -Diet as tolerated -PPI daily for 2 months -Sucralfate 3 times daily x 7 days -Avoidance of all NSAIDs -Check CBC next week through my office (patient does not have PCP), my office will coordinate -Follow up in office with Dr. Dianna in 6 weeks  -Appropriate for discharge today if okay with internal medicine -[facility] will sign off and be available as needed should questions arise   LOS: 0 days   Estefana Keas, [facility]

## 2024-04-19 NOTE — Discharge Summary (Signed)
 Physician Discharge Summary   Patient: Kenneth Glass MRN: [id number] DOB: [birth year]  Admit date:     04/17/2024  Discharge date: 04/19/24  Discharge Physician: Garnette Pelt   PCP: Patient, No Pcp Per   Recommendations at discharge:   Follow up with PCP as needed Follow up with GI in 6 weeks   Discharge Diagnoses: Principal Problem:   Upper GI bleed Active Problems:   Influenza A   Normocytic anemia   borderline prolonged QT interval  Resolved Problems:   * No resolved hospital problems. *  Hospital Course: 43 y.o. male with no significant past medical history who presented to ED with complaints of dark emesis and black, tarry stool. He has a remote history of GI bleeds due to NSAID use in past. He has been taking NSAIDs q 6 hours since feeling bad. He has been feeling bad x 1.5 weeks of headache, sore throat, myalgias and has had vomiting the past 3 days. He states he has had about 2 episodes of dark vomiting a day over the past few days.    He states he was in [place] and came back on Sunday. He has body aches,sore throat and fever. He had a cough, but this is better. He no longer has a sore throat. His headache has persisted and rates it a 8/10. No vision changes or focal deficits. Headache is all over. Dull in nature. Nothing seems to help this, but tylenol  has improved it. He states it is not as bad as it was. Denies any neck pain.  He started to take Ibuprofen  400mg  on Sunday every 6 hours. He has not taken daily.     Denies any fever/chills, vision changes chest pain or palpitations, shortness of breath or cough, abdominal pain,  dysuria or leg swelling.   Assessment and Plan: Upper GI bleed with acute blood loss anemia 43 year old who presented to ED with complaints of headache and 3 days of dark colored emesis with black/tarry stools in setting of NSAID use for flu like symptoms and history of UGIB on NSAIDs in 2006 -GI consulted, now s/p EGD 6/29 with finding of gastritis  and non-bleeding gastric ulcer with visible bleed treated with bipolar cautery -GI recs to continue BID PPI x 3 months and carafate bid x 7 days -pt to f/u with Dr. Dianna in 6 weeks for repeat EGD -Hgb remained stable. Per GI, can d/c today   Influenza A Droplet precautions  7 days of symptoms and not being admitted for respiratory symptoms.   No indication for tamiflu    Normocytic anemia No recent labs for baseline Hgb within normal limits 5 years ago.  Iron studies reviewed, normal   borderline prolonged QT interval Optimized electrolytes    Consultants: GI Procedures performed: EGD  Disposition: Home Diet recommendation:  Regular diet DISCHARGE MEDICATION: Allergies as of 04/19/2024       Reactions   Shellfish Allergy Shortness Of Breath   Sulfa Antibiotics    Childhood; not sure of rxn        Medication List     TAKE these medications    fluticasone  50 MCG/ACT nasal spray Commonly known as: FLONASE  Place 1 spray into both nostrils daily. Start taking on: April 20, 2024   pantoprazole 40 MG tablet Commonly known as: PROTONIX Take 1 tablet (40 mg total) by mouth 2 (two) times daily.   sucralfate 1 g tablet Commonly known as: CARAFATE Take 1 tablet (1 g total) by mouth 4 (  four) times daily -  with meals and at bedtime for 7 days.        Follow-up Information     Follow up with PCP Follow up.   Why: As needed        Dianna Specking, MD Follow up in 6 week(s).   Specialty: Gastroenterology Why: Hospital follow up Contact information: [address] [place] [address] [phone]                Discharge Exam: Fredricka Weights   04/17/24 1333  Weight: 77.1 kg   General exam: Awake, laying in bed, in nad Respiratory system: Normal respiratory effort, no wheezing Cardiovascular system: regular rate, s1, s2 Gastrointestinal system: Soft, nondistended, positive BS Central nervous system: CN2-12 grossly intact,  strength intact Extremities: Perfused, no clubbing Skin: Normal skin turgor, no notable skin lesions seen Psychiatry: Mood normal // no visual hallucinations    Condition at discharge: fair  The results of significant diagnostics from this hospitalization (including imaging, microbiology, ancillary and laboratory) are listed below for reference.   Imaging Studies: No results found.  Microbiology: Results for orders placed or performed during the hospital encounter of 04/17/24  Resp panel by RT-PCR (RSV, Flu A&B, Covid) Anterior Nasal Swab     Status: Abnormal   Collection Time: 04/17/24  3:25 PM   Specimen: Anterior Nasal Swab  Result Value Ref Range Status   SARS Coronavirus 2 by RT PCR NEGATIVE NEGATIVE Final    Comment: (NOTE) SARS-CoV-2 target nucleic acids are NOT DETECTED.  The SARS-CoV-2 RNA is generally detectable in upper respiratory specimens during the acute phase of infection. The lowest concentration of SARS-CoV-2 viral copies this assay can detect is 138 copies/mL. A negative result does not preclude SARS-Cov-2 infection and should not be used as the sole basis for treatment or other patient management decisions. A negative result may occur with  improper specimen collection/handling, submission of specimen other than nasopharyngeal swab, presence of viral mutation(s) within the areas targeted by this assay, and inadequate number of viral copies(<138 copies/mL). A negative result must be combined with clinical observations, patient history, and epidemiological information. The expected result is Negative.  Fact Sheet for Patients:  BloggerCourse.com  Fact Sheet for Healthcare Providers:  SeriousBroker.it  This test is no t yet approved or cleared by the [place]  FDA and  has been authorized for detection and/or diagnosis of SARS-CoV-2 by FDA under an Emergency Use Authorization (EUA). This EUA will remain   in effect (meaning this test can be used) for the duration of the COVID-19 declaration under Section 564(b)(1) of the Act, 21 U.S.C.section 360bbb-3(b)(1), unless the authorization is terminated  or revoked sooner.       Influenza A by PCR POSITIVE (A) NEGATIVE Final   Influenza B by PCR NEGATIVE NEGATIVE Final    Comment: (NOTE) The Xpert Xpress SARS-CoV-2/FLU/RSV plus assay is intended as an aid in the diagnosis of influenza from Nasopharyngeal swab specimens and should not be used as a sole basis for treatment. Nasal washings and aspirates are unacceptable for Xpert Xpress SARS-CoV-2/FLU/RSV testing.  Fact Sheet for Patients: BloggerCourse.com  Fact Sheet for Healthcare Providers: SeriousBroker.it  This test is not yet approved or cleared by the [place]  FDA and has been authorized for detection and/or diagnosis of SARS-CoV-2 by FDA under an Emergency Use Authorization (EUA). This EUA will remain in effect (meaning this test can be used) for the duration of the COVID-19 declaration under Section 564(b)(1)  of the Act, 21 U.S.C. section 360bbb-3(b)(1), unless the authorization is terminated or revoked.     Resp Syncytial Virus by PCR NEGATIVE NEGATIVE Final    Comment: (NOTE) Fact Sheet for Patients: BloggerCourse.com  Fact Sheet for Healthcare Providers: SeriousBroker.it  This test is not yet approved or cleared by the [place]  FDA and has been authorized for detection and/or diagnosis of SARS-CoV-2 by FDA under an Emergency Use Authorization (EUA). This EUA will remain in effect (meaning this test can be used) for the duration of the COVID-19 declaration under Section 564(b)(1) of the Act, 21 U.S.C. section 360bbb-3(b)(1), unless the authorization is terminated or revoked.  Performed at [facility], [address] [place], [place] [address]    Group A Strep by PCR     Status: None   Collection Time: 04/17/24  3:53 PM   Specimen: Throat; Sterile Swab  Result Value Ref Range Status   Group A Strep by PCR NOT DETECTED NOT DETECTED Final    Comment: Performed at [facility], [address] [place], [place] [address]    Labs: CBC: Recent Labs  Lab 04/17/24 1421 04/17/24 2350 04/18/24 0552 04/18/24 1327 04/19/24 0517  WBC 5.7 4.7 4.4 3.5* 3.2*  NEUTROABS 4.3  --   --   --   --   HGB 9.9* 8.7* 9.0* 8.3* 8.1*  HCT 27.7* 25.8* 26.4* 24.5* 24.1*  MCV 82.4 86.3 86.8 86.0 87.3  PLT 190 193 178 163 176   Basic Metabolic Panel: Recent Labs  Lab 04/17/24 1421 04/18/24 0552  NA 136 137  K 4.1 4.3  CL 101 106  CO2 25 26  GLUCOSE 96 87  BUN 31* 20  CREATININE 0.75 0.81  CALCIUM 8.4* 8.1*  MG 2.1  --    Liver Function Tests: Recent Labs  Lab 04/17/24 1421  AST 22  ALT 18  ALKPHOS 65  BILITOT 0.7  PROT 6.8  ALBUMIN 4.0   CBG: No results for input(s): GLUCAP in the last 168 hours.  Discharge time spent: less than 30 minutes.  Signed: Garnette Pelt, MD [facility] 04/19/2024

## 2024-04-19 NOTE — Progress Notes (Signed)
   04/19/24 0907  TOC Brief Assessment  Insurance and Status Reviewed  Patient has primary care physician No  Home environment has been reviewed single family home  Prior level of function: independent  Prior/Current Home Services No current home services  Social Drivers of Health Review SDOH reviewed no interventions necessary  Readmission risk has been reviewed Yes  Transition of care needs no transition of care needs at this time    Heather Saltness, MSW, LCSW 04/19/2024 9:08 AM

## 2024-04-19 NOTE — Plan of Care (Signed)

## 2024-04-20 ENCOUNTER — Encounter (HOSPITAL_COMMUNITY): Payer: Self-pay | Admitting: Internal Medicine

## 2024-04-21 LAB — SURGICAL PATHOLOGY

## 2024-06-16 DIAGNOSIS — K250 Acute gastric ulcer with hemorrhage: Secondary | ICD-10-CM | POA: Diagnosis not present

## 2024-06-16 DIAGNOSIS — Z01818 Encounter for other preprocedural examination: Secondary | ICD-10-CM | POA: Diagnosis not present

## 2024-06-18 ENCOUNTER — Ambulatory Visit (INDEPENDENT_AMBULATORY_CARE_PROVIDER_SITE_OTHER)

## 2024-06-18 VITALS — BP 131/73 | HR 74 | Ht 74.0 in | Wt 181.2 lb

## 2024-06-18 DIAGNOSIS — Z1321 Encounter for screening for nutritional disorder: Secondary | ICD-10-CM | POA: Diagnosis not present

## 2024-06-18 DIAGNOSIS — K922 Gastrointestinal hemorrhage, unspecified: Secondary | ICD-10-CM | POA: Diagnosis not present

## 2024-06-18 DIAGNOSIS — Z1329 Encounter for screening for other suspected endocrine disorder: Secondary | ICD-10-CM

## 2024-06-18 DIAGNOSIS — H6991 Unspecified Eustachian tube disorder, right ear: Secondary | ICD-10-CM | POA: Diagnosis not present

## 2024-06-18 DIAGNOSIS — Z13228 Encounter for screening for other metabolic disorders: Secondary | ICD-10-CM | POA: Diagnosis not present

## 2024-06-18 DIAGNOSIS — H6990 Unspecified Eustachian tube disorder, unspecified ear: Secondary | ICD-10-CM | POA: Insufficient documentation

## 2024-06-18 DIAGNOSIS — Z130 Encounter for screening for diseases of the blood and blood-forming organs and certain disorders involving the immune mechanism: Secondary | ICD-10-CM | POA: Diagnosis not present

## 2024-06-18 DIAGNOSIS — J3089 Other allergic rhinitis: Secondary | ICD-10-CM | POA: Diagnosis not present

## 2024-06-18 DIAGNOSIS — J309 Allergic rhinitis, unspecified: Secondary | ICD-10-CM | POA: Insufficient documentation

## 2024-06-18 DIAGNOSIS — Z7689 Persons encountering health services in other specified circumstances: Secondary | ICD-10-CM

## 2024-06-18 MED ORDER — PANTOPRAZOLE SODIUM 40 MG PO TBEC: 40.0000 mg | DELAYED_RELEASE_TABLET | Freq: Two times a day (BID) | ORAL | 0 refills | Status: DC

## 2024-06-18 MED ORDER — FLUTICASONE PROPIONATE 50 MCG/ACT NA SUSP: 1.0000 | Freq: Every day | NASAL | 2 refills | Status: AC

## 2024-06-18 NOTE — Assessment & Plan Note (Signed)
 Allergic rhinitis with sinus pressure headaches, worsened by environmental factors. Flonase  provides relief. NSAIDs avoided due to gastric ulcer. - Use Flonase  as needed for sinus pressure. - Take Xyzal as needed for allergy symptoms. - Avoid NSAIDs, use Tylenol  for headaches.

## 2024-06-18 NOTE — Patient Instructions (Signed)
 VISIT SUMMARY: Today, you came in for an initial appointment to establish care and manage your medications. We discussed your gastrointestinal symptoms, sinonasal symptoms, and aural pressure and auditory symptoms. We also reviewed your general health maintenance needs.  YOUR PLAN: -GASTRIC ULCER: A gastric ulcer is a sore on the lining of your stomach, often caused by NSAID use and certain foods. You should continue taking pantoprazole  40 mg twice daily and avoid NSAIDs. Your GI consultation is scheduled for October, where you will have an endoscopy and colonoscopy. Your pantoprazole  prescription has been refilled at [facility] on [address]  -EUSTACHIAN TUBE DYSFUNCTION WITH MIDDLE EAR EFFUSION: Eustachian tube dysfunction occurs when the tube connecting your middle ear to the back of your nose doesn't function properly, causing fluid buildup and pressure. You should use Flonase  daily for two weeks and take Xyzal once daily for two weeks. If symptoms persist, we may refer you to an ENT specialist.  -ALLERGIC RHINITIS WITH ASSOCIATED SINUS PRESSURE HEADACHES: Allergic rhinitis is an allergic reaction that causes sneezing, congestion, and sinus pressure. You should use Flonase  as needed for sinus pressure and take Xyzal as needed for allergy symptoms. Avoid NSAIDs and use Tylenol  for headaches.  -GENERAL HEALTH MAINTENANCE: You are due for a tetanus vaccination and need blood work to assess your thyroid function, A1c, cholesterol, kidney, and liver function. We will perform the blood work today, and you can receive the tetanus vaccine if you wish.  INSTRUCTIONS: Please follow up with your GI specialist in October for your endoscopy and colonoscopy. If your ear symptoms persist after two weeks of treatment, consider seeing an ENT specialist.  If you have any problems before your next visit feel free to message me via MyChart (minor issues or questions) or call the office, otherwise you may reach out to  schedule an office visit.  Thank you! Saddie Sacks, PA-C

## 2024-06-18 NOTE — Assessment & Plan Note (Signed)
 Gastric ulcer with flare-ups, likely due to diet and NSAID use. Managed with pantoprazole .  Follows with GI. - Refill pantoprazole  40 mg BID - Continue pantoprazole  twice daily. - Avoid NSAIDs. - Follow up with GI for endoscopy and colonoscopy in October.

## 2024-06-18 NOTE — Progress Notes (Addendum)
 Established Patient Office Visit  Subjective   Patient ID: Kenneth Glass, male    DOB: 10/19/1981  Age: 43 y.o. MRN: [id number]  Chief Complaint  Patient presents with   Establish Care    HPI  Discussed the use of AI scribe software for clinical note transcription with the patient, who gave verbal consent to proceed.  History of Present Illness   Kenneth Glass is a 43 year old male who presents for an establishing care appointment and medication management.  Gastrointestinal symptoms - NSAID induced Stomach ulcer with ongoing management using pantoprazole  40 mg twice daily - Symptoms improved with current therapy, but occasional flare-ups occur, particularly after consuming spicy foods - Recent evaluation by gastroenterology; endoscopy and colonoscopy scheduled for October 2025 - Requires refill of pantoprazole   Sinonasal symptoms - Sinus headaches and pressure, especially with exposure to humidity or temperature changes - Intermittent use of Flonase  provides partial relief - Occasional use of Sudafed for sinus congestion - Flonase  not used daily  Aural pressure and auditory symptoms - Sensation of pressure in both ears, particularly when bending over or moving quickly - Associated 'whooshing' sound in ears - Symptoms have persisted since recent hospital stay          ROS Per HPI.    Objective:     BP 131/73   Pulse 74   Ht 6' 2 (1.88 m)   Wt 181 lb 4 oz (82.2 kg)   SpO2 100%   BMI 23.27 kg/m    Physical Exam Constitutional:      General: He is not in acute distress.    Appearance: Normal appearance.  HENT:     Right Ear: Ear canal and external ear normal. A middle ear effusion is present.     Left Ear: Tympanic membrane, ear canal and external ear normal.  Cardiovascular:     Rate and Rhythm: Normal rate and regular rhythm.     Heart sounds: Normal heart sounds. No murmur heard.    No friction rub. No gallop.  Pulmonary:     Effort: Pulmonary  effort is normal. No respiratory distress.     Breath sounds: Normal breath sounds.  Musculoskeletal:        General: No swelling.  Skin:    General: Skin is warm and dry.  Neurological:     General: No focal deficit present.     Mental Status: He is alert.  Psychiatric:        Mood and Affect: Mood normal.        Behavior: Behavior normal.        Thought Content: Thought content normal.      Results for orders placed or performed in visit on 06/18/24  CBC with Differential/Platelet  Result Value Ref Range   WBC 3.5 3.4 - 10.8 x10E3/uL   RBC 4.01 (L) 4.14 - 5.80 x10E6/uL   Hemoglobin 9.0 (L) 13.0 - 17.7 g/dL   Hematocrit 68.4 (L) 62.4 - 51.0 %   MCV 79 79 - 97 fL   MCH 22.4 (L) 26.6 - 33.0 pg   MCHC 28.6 (L) 31.5 - 35.7 g/dL   RDW 82.8 (H) 88.3 - 84.5 %   Platelets 407 150 - 450 x10E3/uL   Neutrophils 63 Not Estab. %   Lymphs 24 Not Estab. %   Monocytes 9 Not Estab. %   Eos 3 Not Estab. %   Basos 1 Not Estab. %   Neutrophils Absolute 2.2 1.4 - 7.0 x10E3/uL  Lymphocytes Absolute 0.9 0.7 - 3.1 x10E3/uL   Monocytes Absolute 0.3 0.1 - 0.9 x10E3/uL   EOS (ABSOLUTE) 0.1 0.0 - 0.4 x10E3/uL   Basophils Absolute 0.0 0.0 - 0.2 x10E3/uL   Immature Granulocytes 0 Not Estab. %   Immature Grans (Abs) 0.0 0.0 - 0.1 x10E3/uL  Comprehensive metabolic panel with GFR  Result Value Ref Range   Glucose 55 (L) 70 - 99 mg/dL   BUN 6 6 - 24 mg/dL   Creatinine, Ser 9.17 0.76 - 1.27 mg/dL   eGFR 889 >40 fO/fpw/8.26   BUN/Creatinine Ratio 7 (L) 9 - 20   Sodium 140 134 - 144 mmol/L   Potassium 4.3 3.5 - 5.2 mmol/L   Chloride 105 96 - 106 mmol/L   CO2 23 20 - 29 mmol/L   Calcium 8.9 8.7 - 10.2 mg/dL   Total Protein 7.4 6.0 - 8.5 g/dL   Albumin 4.4 4.1 - 5.1 g/dL   Globulin, Total 3.0 1.5 - 4.5 g/dL   Bilirubin Total 0.6 0.0 - 1.2 mg/dL   Alkaline Phosphatase 78 44 - 121 IU/L   AST 20 0 - 40 IU/L   ALT 13 0 - 44 IU/L  Lipid panel  Result Value Ref Range   Cholesterol, Total 131 100 -  199 mg/dL   Triglycerides 51 0 - 149 mg/dL   HDL 57 >60 mg/dL   VLDL Cholesterol Cal 12 5 - 40 mg/dL   LDL Chol Calc (NIH) 62 0 - 99 mg/dL   Chol/HDL Ratio 2.3 0.0 - 5.0 ratio  Hemoglobin A1c  Result Value Ref Range   Hgb A1c MFr Bld 5.4 4.8 - 5.6 %   Est. average glucose Bld gHb Est-mCnc 108 mg/dL  TSH  Result Value Ref Range   TSH 0.909 0.450 - 4.500 uIU/mL  VITAMIN D  25 Hydroxy (Vit-D Deficiency, Fractures)  Result Value Ref Range   Vit D, 25-Hydroxy 34.1 30.0 - 100.0 ng/mL      The 10-year ASCVD risk score (Arnett DK, et al., 2019) is: 3.6%    Assessment & Plan:   Encounter to establish care  Screening for endocrine, nutritional, metabolic and immunity disorder -     VITAMIN D  25 Hydroxy (Vit-D Deficiency, Fractures); Future -     TSH; Future -     Hemoglobin A1c; Future -     Lipid panel; Future -     Comprehensive metabolic panel with GFR; Future -     CBC with Differential/Platelet; Future  Dysfunction of right eustachian tube Assessment & Plan: Eustachian tube dysfunction with fluid behind eardrum, causing intermittent symptoms. No infection. Symptoms improved but persist with movement. - Use Flonase  daily for two weeks. - Take Xyzal once daily for two weeks. - Consider ENT referral if symptoms persist.   Non-seasonal allergic rhinitis due to other allergic trigger Assessment & Plan: Allergic rhinitis with sinus pressure headaches, worsened by environmental factors. Flonase  provides relief. NSAIDs avoided due to gastric ulcer. - Use Flonase  as needed for sinus pressure. - Take Xyzal as needed for allergy symptoms. - Avoid NSAIDs, use Tylenol  for headaches.   Upper GI bleed Assessment & Plan: Gastric ulcer with flare-ups, likely due to diet and NSAID use. Managed with pantoprazole .  Follows with GI. - Refill pantoprazole  40 mg BID - Continue pantoprazole  twice daily. - Avoid NSAIDs. - Follow up with GI for endoscopy and colonoscopy in  October.   Other orders -     Fluticasone  Propionate; Place 1 spray into both nostrils  daily.  Dispense: 15.8 mL; Refill: 2    Return in about 1 year (around 06/18/2025) for Physical.    Saddie JULIANNA Sacks, PA-C

## 2024-06-18 NOTE — Progress Notes (Deleted)
 New Patient Office Visit  Subjective    Patient ID: Kenneth Glass, male    DOB: 1981-01-16  Age: 43 y.o. MRN: [id number]  CC:  Chief Complaint  Patient presents with  . Establish Care    Discussed the use of AI scribe software for clinical note transcription with the patient, who gave verbal consent to proceed.  History of Present Illness     HPI Kenneth Glass presents to establish care. Prior PCP was *** at ***. Last physical was ***. he notes that he requires refills of ***.  Screenings:  Colon Cancer: *** Lung Cancer: *** Breast Cancer: *** Diabetes: *** Ophthalmology*** Foot*** HLD: ***  The ASCVD Risk score (Arnett DK, et al., 2019) failed to calculate for the following reasons:   Cannot find a previous HDL lab   Cannot find a previous total cholesterol lab Hep B Vax: ***  Acute Problems: ***  Outpatient Encounter Medications as of 06/18/2024  Medication Sig  . fluticasone  (FLONASE ) 50 MCG/ACT nasal spray Place 1 spray into both nostrils daily.  . pantoprazole  (PROTONIX ) 40 MG tablet Take 1 tablet (40 mg total) by mouth 2 (two) times daily.  . sucralfate  (CARAFATE ) 1 g tablet Take 1 tablet (1 g total) by mouth 4 (four) times daily -  with meals and at bedtime for 7 days. (Patient not taking: Reported on 06/18/2024)  . [DISCONTINUED] zolpidem  (AMBIEN ) 5 MG tablet Take 1 tablet (5 mg total) by mouth at bedtime as needed for sleep.   No facility-administered encounter medications on file as of 06/18/2024.    Past Medical History:  Diagnosis Date  . Arthritis   . Gastric ulcer   . Hx of excision of mass     Past Surgical History:  Procedure Laterality Date  . ESOPHAGOGASTRODUODENOSCOPY N/A 04/18/2024   Procedure: EGD (ESOPHAGOGASTRODUODENOSCOPY);  Surgeon: Kriss Estefana DEL, DO;  Location: [facility];  Service: Gastroenterology;  Laterality: N/A;  . KNEE SURGERY    . KNEE SURGERY    . TOTAL KNEE ARTHROPLASTY Left 11/07/2015   Procedure: LEFT TOTAL KNEE  ARTHROPLASTY;  Surgeon: Donnice Car, MD;  Location: [facility];  Service: Orthopedics;  Laterality: Left;    Family History  Problem Relation Age of Onset  . Hypertension Mother   . Stroke Father   . Sickle cell anemia Father   . Diabetes Maternal Grandmother   . Hypertension Maternal Grandmother     Social History   Socioeconomic History  . Marital status: Married    Spouse name: Not on file  . Number of children: Not on file  . Years of education: Not on file  . Highest education level: Not on file  Occupational History  . Not on file  Tobacco Use  . Smoking status: Never  . Smokeless tobacco: Never  Vaping Use  . Vaping status: Never Used  Substance and Sexual Activity  . Alcohol use: Yes    Comment: socially  . Drug use: No  . Sexual activity: Not on file  Other Topics Concern  . Not on file  Social History Narrative  . Not on file   Social Drivers of Health   Financial Resource Strain: Not on file  Food Insecurity: No Food Insecurity (04/17/2024)   Hunger Vital Sign   . Worried About Programme researcher, broadcasting/film/video in the Last Year: Never true   . Ran Out of Food in the Last Year: Never true  Transportation Needs: No Transportation Needs (04/17/2024)   PRAPARE -  Transportation   . Lack of Transportation (Medical): No   . Lack of Transportation (Non-Medical): No  Physical Activity: Not on file  Stress: Not on file  Social Connections: Not on file  Intimate Partner Violence: Not At Risk (04/17/2024)   Humiliation, Afraid, Rape, and Kick questionnaire   . Fear of Current or Ex-Partner: No   . Emotionally Abused: No   . Physically Abused: No   . Sexually Abused: No    ROS  Per HPI      Objective    BP 131/73   Pulse 74   Ht 6' 2 (1.88 m)   Wt 181 lb 4 oz (82.2 kg)   SpO2 100%   BMI 23.27 kg/m   Physical Exam  {Labs (Optional):23779}    Assessment & Plan:   There are no diagnoses linked to this encounter.  Assessment and Plan Assessment &  Plan      No follow-ups on file.   Saddie JULIANNA Sacks, PA-C

## 2024-06-18 NOTE — Assessment & Plan Note (Signed)
 Eustachian tube dysfunction with fluid behind eardrum, causing intermittent symptoms. No infection. Symptoms improved but persist with movement. - Use Flonase  daily for two weeks. - Take Xyzal once daily for two weeks. - Consider ENT referral if symptoms persist.

## 2024-06-19 LAB — LIPID PANEL
Chol/HDL Ratio: 2.3 ratio (ref 0.0–5.0)
Cholesterol, Total: 131 mg/dL (ref 100–199)
HDL: 57 mg/dL (ref >39)
LDL Chol Calc (NIH): 62 mg/dL (ref 0–99)
Triglycerides: 51 mg/dL (ref 0–149)
VLDL Cholesterol Cal: 12 mg/dL (ref 5–40)

## 2024-06-19 LAB — VITAMIN D 25 HYDROXY (VIT D DEFICIENCY, FRACTURES): Vit D, 25-Hydroxy: 34.1 ng/mL (ref 30.0–100.0)

## 2024-06-19 LAB — CBC WITH DIFFERENTIAL/PLATELET
Basophils Absolute: 0 x10E3/uL (ref 0.0–0.2)
Basos: 1 %
EOS (ABSOLUTE): 0.1 x10E3/uL (ref 0.0–0.4)
Eos: 3 %
Hematocrit: 31.5 % — ABNORMAL LOW (ref 37.5–51.0)
Hemoglobin: 9 g/dL — ABNORMAL LOW (ref 13.0–17.7)
Immature Grans (Abs): 0 x10E3/uL (ref 0.0–0.1)
Immature Granulocytes: 0 %
Lymphocytes Absolute: 0.9 x10E3/uL (ref 0.7–3.1)
Lymphs: 24 %
MCH: 22.4 pg — ABNORMAL LOW (ref 26.6–33.0)
MCHC: 28.6 g/dL — ABNORMAL LOW (ref 31.5–35.7)
MCV: 79 fL (ref 79–97)
Monocytes Absolute: 0.3 x10E3/uL (ref 0.1–0.9)
Monocytes: 9 %
Neutrophils Absolute: 2.2 x10E3/uL (ref 1.4–7.0)
Neutrophils: 63 %
Platelets: 407 x10E3/uL (ref 150–450)
RBC: 4.01 x10E6/uL — ABNORMAL LOW (ref 4.14–5.80)
RDW: 17.1 % — ABNORMAL HIGH (ref 11.6–15.4)
WBC: 3.5 x10E3/uL (ref 3.4–10.8)

## 2024-06-19 LAB — COMPREHENSIVE METABOLIC PANEL WITH GFR
ALT: 13 IU/L (ref 0–44)
AST: 20 IU/L (ref 0–40)
Albumin: 4.4 g/dL (ref 4.1–5.1)
Alkaline Phosphatase: 78 IU/L (ref 44–121)
BUN/Creatinine Ratio: 7 — ABNORMAL LOW (ref 9–20)
BUN: 6 mg/dL (ref 6–24)
Bilirubin Total: 0.6 mg/dL (ref 0.0–1.2)
CO2: 23 mmol/L (ref 20–29)
Calcium: 8.9 mg/dL (ref 8.7–10.2)
Chloride: 105 mmol/L (ref 96–106)
Creatinine, Ser: 0.82 mg/dL (ref 0.76–1.27)
Globulin, Total: 3 g/dL (ref 1.5–4.5)
Glucose: 55 mg/dL — ABNORMAL LOW (ref 70–99)
Potassium: 4.3 mmol/L (ref 3.5–5.2)
Sodium: 140 mmol/L (ref 134–144)
Total Protein: 7.4 g/dL (ref 6.0–8.5)
eGFR: 110 mL/min/1.73 (ref >59)

## 2024-06-19 LAB — HEMOGLOBIN A1C
Est. average glucose Bld gHb Est-mCnc: 108 mg/dL
Hgb A1c MFr Bld: 5.4 % (ref 4.8–5.6)

## 2024-06-19 LAB — TSH: TSH: 0.909 u[IU]/mL (ref 0.450–4.500)

## 2024-06-22 ENCOUNTER — Ambulatory Visit: Payer: Self-pay

## 2024-06-24 ENCOUNTER — Telehealth: Payer: Self-pay

## 2024-06-24 NOTE — Telephone Encounter (Signed)
 Copied from CRM [id number]. Topic: General - Other >> Jun 23, 2024  3:49 PM Deleta S wrote: Reason for CRM: patient is calling inquiring about an ada to continue to work from home. He would like to know the steps he would to have to take in order to proceed. Please follow up at [phone]. Aware of call back time

## 2024-06-24 NOTE — Telephone Encounter (Signed)
 Called and spoke with patient. He's aware he needs to bring in form from his job to get signed off by provider.

## 2024-07-08 ENCOUNTER — Other Ambulatory Visit: Payer: Self-pay

## 2024-07-08 DIAGNOSIS — K250 Acute gastric ulcer with hemorrhage: Secondary | ICD-10-CM | POA: Diagnosis not present

## 2024-07-08 DIAGNOSIS — D649 Anemia, unspecified: Secondary | ICD-10-CM | POA: Diagnosis not present

## 2024-07-22 ENCOUNTER — Telehealth: Payer: Self-pay | Admitting: *Deleted

## 2024-07-22 NOTE — Telephone Encounter (Unsigned)
 Copied from CRM #[id number]. Topic: General - Other >> Jul 21, 2024 11:21 AM Delon DASEN wrote: Reason for CRM: inquiring about accommodation form for employer through [employer]- they said they faxed over the form- please call [phone] need it by tomorrow

## 2024-07-23 DIAGNOSIS — D649 Anemia, unspecified: Secondary | ICD-10-CM | POA: Diagnosis not present

## 2024-07-23 NOTE — Telephone Encounter (Signed)
 Pt informed of below and he will reach out to them to resend.    Kenneth Glass, [place] to Me (Selected Message)     07/22/24  8:03 PM No - I have not received anything but if they fax it I am happy to fill it out before the end of the day tomorrow.

## 2024-07-30 DIAGNOSIS — K2930 Chronic superficial gastritis without bleeding: Secondary | ICD-10-CM | POA: Diagnosis not present

## 2024-07-30 DIAGNOSIS — Z1211 Encounter for screening for malignant neoplasm of colon: Secondary | ICD-10-CM | POA: Diagnosis not present

## 2024-07-30 DIAGNOSIS — K3189 Other diseases of stomach and duodenum: Secondary | ICD-10-CM | POA: Diagnosis not present

## 2024-07-30 DIAGNOSIS — K253 Acute gastric ulcer without hemorrhage or perforation: Secondary | ICD-10-CM | POA: Diagnosis not present

## 2024-07-30 DIAGNOSIS — D509 Iron deficiency anemia, unspecified: Secondary | ICD-10-CM | POA: Diagnosis not present

## 2024-07-30 DIAGNOSIS — K259 Gastric ulcer, unspecified as acute or chronic, without hemorrhage or perforation: Secondary | ICD-10-CM | POA: Diagnosis not present

## 2025-06-22 ENCOUNTER — Encounter
